# Patient Record
Sex: Female | Born: 1975
Health system: Southern US, Community
[De-identification: ages and names within clinical notes are randomized; demographics above are authoritative.]

## PROBLEM LIST (undated history)

## (undated) DIAGNOSIS — E669 Obesity, unspecified: Secondary | ICD-10-CM

## (undated) DIAGNOSIS — D649 Anemia, unspecified: Secondary | ICD-10-CM

## (undated) DIAGNOSIS — M069 Rheumatoid arthritis, unspecified: Secondary | ICD-10-CM

## (undated) DIAGNOSIS — I10 Essential (primary) hypertension: Secondary | ICD-10-CM

## (undated) HISTORY — DX: Essential (primary) hypertension: I10

## (undated) HISTORY — PX: BREAST SURGERY: SHX581

## (undated) HISTORY — DX: Anemia, unspecified: D64.9

## (undated) HISTORY — PX: TUBAL LIGATION: SHX77

## (undated) HISTORY — PX: REDUCTION MAMMAPLASTY: SUR839

## (undated) HISTORY — PX: APPENDECTOMY: SHX54

---

## 1997-07-17 ENCOUNTER — Emergency Department (HOSPITAL_COMMUNITY): Admission: EM | Admit: 1997-07-17 | Discharge: 1997-07-17 | Payer: Self-pay | Admitting: Internal Medicine

## 1998-09-03 ENCOUNTER — Emergency Department (HOSPITAL_COMMUNITY): Admission: EM | Admit: 1998-09-03 | Discharge: 1998-09-03 | Payer: Self-pay | Admitting: Emergency Medicine

## 1998-09-03 ENCOUNTER — Encounter: Payer: Self-pay | Admitting: Emergency Medicine

## 1998-12-03 ENCOUNTER — Other Ambulatory Visit: Admission: RE | Admit: 1998-12-03 | Discharge: 1998-12-03 | Payer: Self-pay | Admitting: Obstetrics and Gynecology

## 1999-01-15 ENCOUNTER — Ambulatory Visit (HOSPITAL_COMMUNITY): Admission: RE | Admit: 1999-01-15 | Discharge: 1999-01-15 | Payer: Self-pay

## 1999-01-15 ENCOUNTER — Encounter: Payer: Self-pay | Admitting: Obstetrics and Gynecology

## 1999-04-01 ENCOUNTER — Encounter: Payer: Self-pay | Admitting: Obstetrics and Gynecology

## 1999-04-01 ENCOUNTER — Ambulatory Visit (HOSPITAL_COMMUNITY): Admission: RE | Admit: 1999-04-01 | Discharge: 1999-04-01 | Payer: Self-pay | Admitting: Obstetrics and Gynecology

## 1999-04-29 ENCOUNTER — Ambulatory Visit (HOSPITAL_COMMUNITY): Admission: RE | Admit: 1999-04-29 | Discharge: 1999-04-29 | Payer: Self-pay | Admitting: Obstetrics and Gynecology

## 1999-04-29 ENCOUNTER — Encounter: Payer: Self-pay | Admitting: Obstetrics and Gynecology

## 1999-06-08 ENCOUNTER — Inpatient Hospital Stay (HOSPITAL_COMMUNITY): Admission: AD | Admit: 1999-06-08 | Discharge: 1999-06-11 | Payer: Self-pay | Admitting: Obstetrics and Gynecology

## 2006-08-16 LAB — HM HIV SCREENING LAB: HM HIV Screening: NEGATIVE

## 2006-08-16 LAB — HM HEPATITIS C SCREENING LAB: HM Hepatitis Screen: NEGATIVE

## 2007-01-12 ENCOUNTER — Inpatient Hospital Stay (HOSPITAL_COMMUNITY): Admission: RE | Admit: 2007-01-12 | Discharge: 2007-01-15 | Payer: Self-pay | Admitting: Obstetrics and Gynecology

## 2007-01-12 ENCOUNTER — Encounter (INDEPENDENT_AMBULATORY_CARE_PROVIDER_SITE_OTHER): Payer: Self-pay | Admitting: Obstetrics and Gynecology

## 2009-10-02 ENCOUNTER — Emergency Department (HOSPITAL_BASED_OUTPATIENT_CLINIC_OR_DEPARTMENT_OTHER): Admission: EM | Admit: 2009-10-02 | Discharge: 2009-10-02 | Payer: Self-pay | Admitting: Emergency Medicine

## 2010-04-15 LAB — RAPID STREP SCREEN (MED CTR MEBANE ONLY): Streptococcus, Group A Screen (Direct): POSITIVE — AB

## 2010-06-15 NOTE — Discharge Summary (Signed)
[name], [name] NO.:  [id number]   MEDICAL RECORD NO.:  [id number]          PATIENT TYPE:  INP   LOCATION:  9138                          FACILITY:  [facility]   PHYSICIAN:  Zenaida Niece, M.D.DATE OF BIRTH:  04-25-1975   DATE OF ADMISSION:  01/12/2007  DATE OF DISCHARGE:  01/15/2007                               DISCHARGE SUMMARY   ADMISSION DIAGNOSES:  1. Intrauterine pregnancy at 38+ weeks.  2. Prior cesarean section.  3. Desires surgical sterility.   DISCHARGE DIAGNOSES:  1. Intrauterine pregnancy at 38+ weeks.  2. Prior cesarean section.  3. Desires surgical sterility.   PROCEDURES:  On December 12, she had a repeat low transverse cesarean  section and bilateral partial salpingectomy.   HISTORY AND PHYSICAL:  Please see chart for full History and Physical.  Briefly, this is a 35 year old black female, gravida 2, para 1-0-0-1  with an EGA of 38+ weeks who was admitted for repeat cesarean section.  Prenatal care essentially uncomplicated and past history significant for  appendectomy and cesarean section.  Physical exam significant for a  weight of 326 pounds, a benign gravid abdomen with a fundal height of 41  cm, and cervix was closed, thick and high.   HOSPITAL COURSE:  The patient was admitted on the day of surgery and  underwent repeat cesarean section and tubal ligation under spinal  anesthesia.  She had normal anatomy except for some adhesions of the  uterus to the anterior abdominal wall.  She delivered a viable female  infant with Apgars of 9/9, weight 8 pounds 9 ounces.  Placenta delivered  spontaneously and was sent for cord blood collection.  Estimated blood  loss was 800 mL.   Postoperatively, she had no significant complications.  She did  initially have some decreased urine output which just picked up on its  own.  Preoperative hemoglobin 11, postoperative 9.5.  On postoperative  #3, she is felt to be stable enough for discharge  home.  Her incision is  healing well.   DISCHARGE INSTRUCTIONS:  Regular diet, pelvic rest, no strenuous  activity.  Followup is in 4 days for staple removal.   MEDICATIONS:  1. Percocet, #40, one to two p.o. q. 4-6 hours p.r.n. pain.  2. Over-the-counter ibuprofen as needed.   She is given our discharge pamphlet.      Zenaida Niece, M.D.  Electronically Signed     TDM/MEDQ  D:  01/15/2007  T:  01/15/2007  Job:  [id number]

## 2010-06-15 NOTE — Op Note (Signed)
[name], [name] NO.:  [id number]   MEDICAL RECORD NO.:  [id number]          PATIENT TYPE:  INP   LOCATION:  9138                          FACILITY:  [facility]   PHYSICIAN:  Zenaida Niece, M.D.DATE OF BIRTH:  1975/10/28   DATE OF PROCEDURE:  01/12/2007  DATE OF DISCHARGE:                               OPERATIVE REPORT   PREOPERATIVE DIAGNOSES:  1. Intrauterine pregnancy at 38+ weeks.  2. Previous cesarean section.  3. Desires surgical sterility.   POSTOPERATIVE DIAGNOSES:  1. Intrauterine pregnancy at 38+ weeks.  2. Previous cesarean section.  3. Desires surgical sterility.   PROCEDURE:  1. Repeat low transverse cesarean section.  2. Bilateral partial salpingectomy.   SURGEON:  Zenaida Niece, M.D.   ASSISTANT:  Sherron Monday, M.D.   ANESTHESIA:  Spinal.   FINDINGS:  The patient had normal gravid anatomy with 2 adhesions of the  uterus to the anterior abdominal wall.  She delivered a viable female  infant with Apgars of 9 and 9 that weighed 8 pounds 9 ounces.   SPECIMENS:  Placenta sent for cord blood collection.   ESTIMATED BLOOD LOSS:  800 mL.   COMPLICATIONS:  None.   PROCEDURE IN DETAIL:  The patient was taken to the operating room and  placed in the sitting position.  Dr. Jean Rosenthal instilled spinal anesthesia  and she was placed in the dorsal supine position with a left lateral  tilt.  The abdomen was then prepped and draped in the usual sterile  fashion and a Foley catheter was inserted.  The level of her anesthesia  was found to be adequate and her abdomen was entered in a standard  Pfannenstiel incision through her old scar.  Once the abdominal cavity  was entered, there was noted be a small adhesion in the midline and this  was taken down with electrocautery.  A thicker adhesion in the right  fundus was left at this point.  The Alexis disposable self-retaining  retractor was placed and the lower uterine segment was well exposed.  A  4-cm transverse incision was made in the lower uterine segment, pushing  the bladder inferior.  Once the uterine cavity was entered, the incision  was extended bilaterally digitally.  Membranes were ruptured and  revealed clear fluid.  The fetal vertex was grasped and was difficult to  deliver.  A vacuum was applied and with one pull, the head delivered  without difficulty.  Mouth and nares were suctioned and a nuchal cord x3  was reduced.  The remainder of the infant then delivered atraumatically.  Cord was doubly clamped and cut and the infant handed to the awaiting  pediatric team.  Placenta delivered spontaneously and was sent for cord  blood collection.  Uterus was wiped dry with a clean lap pad and all  clots and debris were removed.  The uterine incision was inspected and  found be free of extensions.  Uterine incision was then closed in 1  layer, being a running-locking layer with #1 chromic with adequate  hemostasis.  The adhesion of the right fundus to the anterior abdominal  wall was taken down with electrocautery; this was a fairly thick  adhesion that was taken down and was hemostatic.  Attention was turned  to tubal ligation.  Both fallopian tubes were identified and traced to  their fimbriated ends.  The middle of each tube was grasped with a  Babcock clamp.  A window was made in an avascular portion of the  mesosalpinx.  A 0 plain gut suture was passed through this window, tied  proximally and wrapped around distally to create a knuckle of tube.  The  knuckle of tube was then removed sharply.  This was done bilaterally and  on both sides, both tubal ostia were identified and the stumps were  hemostatic.  The uterine incision was again inspected and found to be  hemostatic.  Subfascial space was then irrigated and made hemostatic  with electrocautery.  Fascia was closed in running fashion starting at  both ends and meeting in the middle with 0 Vicryl.  Subcutaneous tissue   was then irrigated and made hemostatic with electrocautery.  Subcutaneous tissue was then closed with running 2-0 plain gut suture.  Skin was then closed with staples followed by a sterile dressing.  The  patient tolerated the procedure well and was taken to the recovery room  in stable condition.  Counts were correct x2 and she received Ancef 1  gram IV after cord clamp.      Zenaida Niece, M.D.  Electronically Signed     TDM/MEDQ  D:  01/12/2007  T:  01/13/2007  Job:  [id number]

## 2010-06-15 NOTE — H&P (Signed)
[name], [name]             ACCOUNT NO.:  [id number]   MEDICAL RECORD NO.:  [id number]          PATIENT TYPE:  INP   LOCATION:  NA                            [facility]ILITY:  [facility]   PHYSICIAN:  Zenaida Niece, M.D.DATE OF BIRTH:  November 23, 1975   DATE OF ADMISSION:  01/12/2007  DATE OF DISCHARGE:                              HISTORY & PHYSICAL   CHIEF COMPLAINT:  Repeat cesarean section.   HISTORY OF PRESENT ILLNESS:  This is a 35 year old black female, gravida  2, para 1-0-0-1 with an EGA of 38 plus weeks by a 9-week ultrasound with  a due date of January 20, 2007 who presents for repeat cesarean  section.  Prenatal care has been complicated by the fact that the  patient has had a prior cesarean section and initially wished to undergo  a VBAC.  However, she has recently had size greater than dates, and  ultrasound on December 25, 2006 revealed an estimated fetal weight of  3500 plus grams and a normal AFI.  In light of this, options were  discussed with the patient, and she now wishes to proceed with repeat  cesarean section.  She is being admitted for this at this time.   PRENATAL LABORATORIES:  Blood type is O positive with a negative  antibody screen.  RPR nonreactive.  Rubella immune.  Hepatitis B surface  antigen negative.  HIV negative.  Gonorrhea and Chlamydia negative.  One  hour Glucola is 95.  Group B strep is negative.  Prenatal care was  otherwise uncomplicated.   PAST OBSTETRIC HISTORY:  In 2001, cesarean section at 38 plus weeks.  The baby weighed 7 pounds, 13 ounces.  Cesarean section was for failure  to progress and non-reassuring fetal heart rate tracing.   PAST MEDICAL HISTORY:  Otherwise negative.   PAST SURGICAL HISTORY:  1. Appendectomy.  2. Cesarean section.   ALLERGIES:  None known.   MEDICATIONS:  None.   FAMILY HISTORY:  Noncontributory.   REVIEW OF SYSTEMS:  Just normal complaints of pregnancy.   PHYSICAL EXAMINATION:  VITAL SIGNS:  Weight is  326 pounds.  Blood  pressure is 110/70.  GENERAL:  This is an obese, gravid female in no acute distress.  NECK:  Supple without lymphadenopathy or thyromegaly.  LUNGS:  Clear to auscultation.  HEART:  Regular rate and rhythm without murmur.  ABDOMEN:  Obese, gravid, nontender with a fundal height of 41 cm.  EXTREMITIES:  The extremities have 1+ edema and are nontender.  Cervix is closed, thick and high with a vertex presentation.   ASSESSMENT:  Intrauterine pregnancy at 38 plus weeks with prior cesarean  section.  The patient now wishes to undergo repeat cesarean section.  All risks of surgery have been discussed, and she wishes to proceed.  The patient also has recently informed us that she wants a tubal  ligation.  She understands this is permanent and that there is  approximately a 1 in 150 failure rate.   PLAN:  The plan is to admit the patient on the day of surgery for a  repeat cesarean section and  tubal ligation.      Zenaida Niece, M.D.  Electronically Signed     Zenaida Niece, M.D.  Electronically Signed    TDM/MEDQ  D:  01/11/2007  T:  01/11/2007  Job:  [id number]

## 2010-06-18 NOTE — H&P (Signed)
[facility]  Patient:    CHALENE, TREU                    MRN: [id number] Adm. Date:  [id number] Attending:  Oliver Pila                         History and Physical  HISTORY OF PRESENT ILLNESS:   This is a 35 year old black female, para 0, gravida 1, at 38-5/7 weeks with [facility] of Jun 17, 1999, by first trimester sonogram.  She presented to labor and delivery complaining of contractions every five minutes or two hours.  Cervical examination in the triage unit was 1 to 2 cm, 90%, and -1. The patient was very uncomfortable, breathing with contractions.  Pregnancy was complicated by possible suspected macrosomia at 13 weeks with mild fetal pyelectasis, however, repeat sonogram at 34 weeks showed no pyelectasis and fetal weight 2298 grams or 65th percentile.  Otherwise, the patient had positive Trichomoniasis in the first trimester for which she and her partner were treated. Blood group and type O positive with a negative antibody, sickle cell negative, RPR nonreactive, rubella immune, hepatitis B surface antigen negative, HIV negative, GC and Chlamydia negative, Group B Strep negative.  PAST OBSTETRIC HISTORY:       Negative.  PAST MEDICAL HISTORY:         Negative.  PAST SURGICAL HISTORY:        Appendectomy in 1985.  PAST GYN HISTORY:             Trichomoniasis as noted in the present illness.  MEDICATIONS:                  None.  ALLERGIES:                    No known drug allergies.  FAMILY HISTORY:               Unremarkable.  PHYSICAL EXAMINATION:  GENERAL:                      Revealed a well-developed, well-nourished black female quite obese with a blood pressure 107/66, pulse 74, and temperature 99.0.  HEENT:                        Unremarkable.  LUNGS:                        Clear to P&A.  HEART:                        Normal size and sounds, no murmurs.  ABDOMEN:                      Gravid.  Fundal height on Jun 03, 1999, was 38 cm.  Fetal heart tones showed recurrent late decellerations, but good variability.  PELVIC:                       Cervix on admission was completely effaced, 2 cm, -1 station.  Alvino Chapel, M.D. performed amniotic rupture with clear fluid.  IMPRESSION:                   Intrauterine pregnancy at 38+ weeks, early labor.   The patient was later begun on  Pitocin augmentation, but began having late decellerations some time after an epidural was placed.  The Pitocin was discontinued.  The patient continued to contract on her own.  She wanted to restart the Pitocin, but the nurse called me at 7:30 p.m. and claimed that the patient as having late decellerations.  I came and restarted the Pitocin because at that time she was not having decellerations and she did have some mild decellerations after I restarted the Pitocin, but the disturbing fact was that the cervix remained 3 cm and the vertex was at -2 station.  The cervix had remained 3 cm for at least eight or nine hours inspite of some contractions and admittedly not ideal contractions because of the decellerations making use of Pitocin unwise in my opinion.  The situation of failure to progress and recurrent decellerations has been discussed with the patient and her partner and we are prepared to move to cesarean section. DD:  06/08/99 TD:  06/09/99 Job: [id number]

## 2010-06-18 NOTE — Discharge Summary (Signed)
[facility]  Patient:    [name], [name]                    MRN: [id number] Proc. Date: 06/08/99 Adm. Date:  [id number] Disc. Date: [id number] Attending:  Oliver Pila                           Discharge Summary  HISTORY OF PRESENT ILLNESS:   A 35 year old black female, para 0, gravida 1, at  38-5/[redacted] weeks gestation, [facility] Jun 17, 1999, by first trimester sonogram, presented to labor and delivery complaining of uterine contractions every five minutes for two hours.  In the maternity admissions unit, cervix was 1 to 2 cm, 90%, and vertex -1 station.  The patient was very uncomfortable, but breathing with contractions.  Pregnancy was complicated by possible macrosomia at 32 weeks with mild fetal pyelectasis, however, repeat sonogram at 34 weeks showed no pyelectasis and fetal weight was 2298 grams or 65th percentile.  Otherwise, the patient had positive Trichomoniasis in the first trimester for which she and her partner were treated. Blood group and type O positive with a negative antibody, negative sickle cell, RPR nonreactive, rubella immune, hepatitis B surface antigen negative, HIV negative, GC and Chlamydia negative, Group B Strep negative.  One-hour Glucola was 95.  HOSPITAL COURSE:              After admission to the hospital, the patient had artificial rupture of membranes by Alvino Chapel, M.D.  By 1:35 p.m. the cervix remained at 3 cm.  She requested an epidural.  Pitocin was begun, but discontinued because of late decellerations.  By 5:45 p.m. the contractions were every three to five minutes.  The cervix remained 3 cm.  Pitocin was asked to be begun again, but because of late decellerations, the nurse did not begin the Pitocin.  By 7:30 p.m. I had evaluated the patient and started the Pitocin again. There were some decellerations, but the item that pushed me to cesarean section was that inspite of contractions, she  remained at 3 cm for over eight hours.  She then went to the cesarean section room for a low transverse cesarean section by Malachi Pro. Ambrose Mantle, M.D. under epidural anesthesia with delivery of a 7 pound 13 ounce female, Apgars 8 at one minute and 9 at five minutes.  Estimated blood loss was about 1000 cc.  Postoperatively, the patient had a very uneventful course.  Staples were removed.  Steri-Strips were applied on the third postoperative day and she was discharged.  LABORATORY DATA:              Hemoglobin on admission of 10.3, hematocrit 30.9,  white count 11,300, and platelet count 271,000.  First postoperative day hemoglobin 9.2, hematocrit 27.7, platelet count 231,000, and white count 17,000.  RPR was nonreactive.  FINAL DIAGNOSES:              1. Intrauterine pregnancy at 38+ weeks.                               2. Delivered vertex by cesarean section.                               3. Failure to progress in labor.  4. Nonreassuring fetal heart rate pattern.  PROCEDURE:                    Low transverse cesarean section.  CONDITION ON DISCHARGE:       Improved.  DISCHARGE INSTRUCTIONS:       Include our regular discharge instruction booklet. Specifically she is asked not to do any heavy lifting or strenuous activity, and to call with any extremely heavy bleeding or temperature elevation above 100 degrees, call with any unusual problems.  DISCHARGE MEDICATIONS:        1. Ibuprofen 800 mg 20 tablets one every eight hours                                  as needed is given at discharge.  FOLLOW-UP:                    The patient is advised to return in 10 to 14 days for follow-up examination. DD:  06/11/99 TD:  06/14/99 Job: [id number]

## 2010-06-18 NOTE — Op Note (Signed)
[facility]  Patient:    [name], [name]                    MRN: [id number] Proc. Date: 06/08/99 Adm. Date:  [id number] Attending:  Oliver Pila                           Operative Report  PREOPERATIVE DIAGNOSIS:       Intrauterine pregnancy at 38-1/2 weeks.  Failure o progress in labor.  Nonreassuring fetal heart rate pattern.  POSTOPERATIVE DIAGNOSIS:      Intrauterine pregnancy at 38-1/2 weeks.  Failure o progress in labor.  Nonreassuring fetal heart rate pattern.  OPERATION:                    Low transverse cesarean section.  SURGEON:                      Malachi Pro. Ambrose Mantle, M.D.  ASSISTANT:  ANESTHESIA:                   Epidural.  ESTIMATED BLOOD LOSS:  DESCRIPTION OF PROCEDURE:     The patient was brought to the operating room and the epidural anesthetic was boosted.  During the time she was on the operating table, the fetal heart rate drifted as low as 85 and again to 100, but otherwise was in the 120 to 140 range.  The abdomen was prepped with Betadine solution and draped as a sterile field.  Anesthesia was confirmed.  A Foley catheter was already indwelling.  The abdomen was entered by a transverse incision through the skin,  subcutaneous tissue, and fascia.  The fascia was then separated from the rectus  muscles superiorly and inferiorly.  The rectus muscles were then split in the midline and the peritoneum was opened vertically.  The lower uterine segment was exposed.  I did not advance the bladder.  I could tell that I was well above the bladder, so I went through the peritoneum, through the uterine muscle, entered he endometrial cavity with my finger, extended the incision with the bandage scissors in a slightly upward and lateral direction on both sides of the midline.  I delivered the vertex through the incisional opening, suctioned the nose and pharynx, and then delivered the remainder of the body.  Cord was  clamped and the infant was given to [place] Q. Mikle Bosworth, M.D. who was in attendance.  She assigned a 7 pound 13 ounce female infant, Apgars 8 at one minute and 9 at five minutes.  The placenta was then delivered intact.  The inside of the uterus was inspected and  found to be free of any fragments.  Additional membranes were removed.  Then the uterus was closed in two layers using a running locked suture of 0 Vicryl on the first layer, nonlocking suture of the same material on the second layer, a couple of extra figure-of-eight sutures were used to completely oppose the myometrium.  Hemostasis was achieved along the cut edge of the peritoneum over the lower uterine segment with the cautery.  No bleeding was noted.  The peritoneum seemed to sort of fold together, so I did not use suture.  I liberally irrigated the abdominal cavity.  There was no bleeding.  Both tubes and ovaries appeared normal as did he uterus.  The peritoneum of the abdominal wall was then held together.  I  irrigated the extraperitoneal space.  There was no significant bleeding.  I did not close the peritoneum.  I closed the rectus muscles with several interrupted sutures of 0 Vicryl and then closed the fascia with two running sutures of 0 Vicryl, the subcu with a running 3-0 Vicryl, and the skin was closed with staples.  The patient seemed to tolerate the procedure well.  Estimated blood loss was about 800 to 1000 cc.  Sponge, needle, and instrument counts were correct.  She was returned to the recovery room in satisfactory condition. DD:  06/08/99 TD:  06/09/99 Job: [id number]

## 2010-11-08 LAB — CBC
HCT: 28.1 — ABNORMAL LOW
HCT: 32.5 — ABNORMAL LOW
Hemoglobin: 11 — ABNORMAL LOW
Hemoglobin: 9.5 — ABNORMAL LOW
MCHC: 33.9
MCV: 83
RBC: 3.38 — ABNORMAL LOW
RBC: 3.88
WBC: 9.9

## 2014-08-06 ENCOUNTER — Encounter (HOSPITAL_BASED_OUTPATIENT_CLINIC_OR_DEPARTMENT_OTHER): Payer: Self-pay | Admitting: Emergency Medicine

## 2014-08-06 ENCOUNTER — Emergency Department (HOSPITAL_BASED_OUTPATIENT_CLINIC_OR_DEPARTMENT_OTHER): Payer: Managed Care, Other (non HMO)

## 2014-08-06 ENCOUNTER — Emergency Department (HOSPITAL_BASED_OUTPATIENT_CLINIC_OR_DEPARTMENT_OTHER)
Admission: EM | Admit: 2014-08-06 | Discharge: 2014-08-06 | Disposition: A | Discharge status: Home / Self Care | Payer: Managed Care, Other (non HMO) | Attending: Emergency Medicine | Admitting: Emergency Medicine

## 2014-08-06 DIAGNOSIS — J40 Bronchitis, not specified as acute or chronic: Secondary | ICD-10-CM | POA: Insufficient documentation

## 2014-08-06 DIAGNOSIS — R05 Cough: Secondary | ICD-10-CM | POA: Diagnosis present

## 2014-08-06 MED ORDER — AEROCHAMBER Z-STAT PLUS/MEDIUM MISC
1.0000 | Freq: Once | Status: AC
  Administered 2014-08-06: 1
  Filled 2014-08-06: qty 1

## 2014-08-06 MED ORDER — BENZONATATE 100 MG PO CAPS: 100.0000 mg | ORAL_CAPSULE | Freq: Three times a day (TID) | ORAL | Status: DC

## 2014-08-06 MED ORDER — ALBUTEROL SULFATE HFA 108 (90 BASE) MCG/ACT IN AERS
2.0000 | INHALATION_SPRAY | Freq: Once | RESPIRATORY_TRACT | Status: AC
  Administered 2014-08-06: 2 via RESPIRATORY_TRACT
  Filled 2014-08-06: qty 6.7

## 2014-08-06 NOTE — ED Notes (Signed)
Patient transported to X-ray 

## 2014-08-06 NOTE — ED Provider Notes (Signed)
CSN: [id number]     Arrival date & time 08/06/14  2025 History   First MD Initiated Contact with Patient 08/06/14 2040     Chief Complaint  Patient presents with  . Cough     (Consider location/radiation/quality/duration/timing/severity/associated sxs/prior Treatment) HPI Comments: Patient presents with complaint of cough for one week. Patient initially had some nasal congestion and cough was mildly productive but it has since become a dry cough. She reports occasional wheezing. No fevers, shortness of breath. No signs and symptoms or risk factors for DVT/PE. Patient does not have a history of heart failure. She's never had asthma or bronchitis. She is not a smoker. No treatments prior to arrival. Onset of symptoms acute. Course is constant. Nothing makes symptoms better or worse.  The history is provided by the patient.    History reviewed. No pertinent past medical history. Past Surgical History  Procedure Laterality Date  . Cesarean section      X2  . Breast surgery    . Appendectomy    . Tubal ligation     No family history on file. History  Substance Use Topics  . Smoking status: Never Smoker   . Smokeless tobacco: Not on file  . Alcohol Use: Yes     Comment: occ   OB History    No data available     Review of Systems  Constitutional: Negative for fever, chills and fatigue.  HENT: Positive for congestion. Negative for ear pain, rhinorrhea, sinus pressure and sore throat.   Eyes: Negative for redness.  Respiratory: Positive for cough and wheezing.   Gastrointestinal: Negative for nausea, vomiting, abdominal pain and diarrhea.  Genitourinary: Negative for dysuria.  Musculoskeletal: Negative for myalgias and neck stiffness.  Skin: Negative for rash.  Neurological: Negative for headaches.  Hematological: Negative for adenopathy.    Allergies  Review of patient's allergies indicates no known allergies.  Home Medications   Prior to Admission medications   Not on  File   BP 132/93 mmHg  Pulse 84  Temp(Src) 98.5 F (36.9 C) (Oral)  Resp 18  Ht 5' 7.5" (1.715 m)  Wt 300 lb (136.079 kg)  BMI 46.27 kg/m2  SpO2 100%  LMP 08/06/2014   Physical Exam  Constitutional: She appears well-developed and well-nourished.  HENT:  Head: Normocephalic and atraumatic.  Right Ear: Tympanic membrane, external ear and ear canal normal.  Left Ear: Tympanic membrane, external ear and ear canal normal.  Nose: Mucosal edema present. No rhinorrhea.  Mouth/Throat: Uvula is midline, oropharynx is clear and moist and mucous membranes are normal.  Eyes: Conjunctivae are normal. Right eye exhibits no discharge. Left eye exhibits no discharge.  Neck: Normal range of motion. Neck supple.  Cardiovascular: Normal rate, regular rhythm and normal heart sounds.   Pulmonary/Chest: Effort normal and breath sounds normal.  Abdominal: Soft. There is no tenderness.  Neurological: She is alert.  Skin: Skin is warm and dry.  Psychiatric: She has a normal mood and affect.  Nursing note and vitals reviewed.   ED Course  Procedures (including critical care time) Labs Review Labs Reviewed - No data to display  Imaging Review Dg Chest 2 View  08/06/2014   CLINICAL DATA:  Cough and wheezing for 1 week.  EXAM: CHEST  2 VIEW  COMPARISON:  None.  FINDINGS: The lungs are clear. Heart size is normal. No pneumothorax or pleural effusion. No focal bony abnormality.  IMPRESSION: No acute disease.   Electronically Signed   By: Marcello Moores  Dalessio M.D.   On: 08/06/2014 21:25     EKG Interpretation None       8:55 PM Patient seen and examined. Work-up initiated. Medications ordered.   Vital signs reviewed and are as follows: BP 132/93 mmHg  Pulse 84  Temp(Src) 98.5 F (36.9 C) (Oral)  Resp 18  Ht 5' 7.5" (1.715 m)  Wt 300 lb (136.079 kg)  BMI 46.27 kg/m2  SpO2 100%  LMP 08/06/2014  10:00 PM patient updated on x-ray results. Will discharge to home with albuterol inhaler, Tessalon.  Patient encouraged to return with worsening symptoms including fever, trouble breathing, shortness of breath, leg swelling, or other concerns. Patient verbalizes understanding and agrees with plan.  MDM   Final diagnoses:  Bronchitis   Patient with likely viral bronchitis. No concern for PNA given normal lung exam/x-ray. Antibiotics not indicated. Conservative therapy indicated.      Carlisle Cater, PA-C 08/06/14 2202  Tanna Furry, MD 08/07/14 [phone]

## 2014-08-06 NOTE — ED Notes (Signed)
Dry cough for a week. Has not taken anything OTC.

## 2014-08-06 NOTE — Discharge Instructions (Signed)
Please read and follow all provided instructions.  Your diagnoses today include:  1. Bronchitis    Tests performed today include:  Chest x-ray - does not show any pneumonia  Vital signs. See below for your results today.   Medications prescribed:   Albuterol inhaler - medication that opens up your airway  Use inhaler as follows: 1-2 puffs with spacer every 4 hours as needed for wheezing, cough, or shortness of breath.    Tessalon Perles - cough suppressant medication  Take any prescribed medications only as directed.  Home care instructions:  Follow any educational materials contained in this packet.  Follow-up instructions: Please follow-up with your primary care provider in the next 3 days for further evaluation of your symptoms and a recheck if you are not feeling better.   Return instructions:   Please return to the Emergency Department if you experience worsening symptoms.  Please return with worsening wheezing, shortness of breath, or difficulty breathing.  Return with persistent fever above 101F.   Please return if you have any other emergent concerns.  Additional Information:  Your vital signs today were: BP 132/93 mmHg   Pulse 84   Temp(Src) 98.5 F (36.9 C) (Oral)   Resp 18   Ht 5' 7.5" (1.715 m)   Wt 300 lb (136.079 kg)   BMI 46.27 kg/m2   SpO2 100%   LMP 08/06/2014 If your blood pressure (BP) was elevated above 135/85 this visit, please have this repeated by your doctor within one month. --------------

## 2014-09-03 ENCOUNTER — Encounter (HOSPITAL_BASED_OUTPATIENT_CLINIC_OR_DEPARTMENT_OTHER): Payer: Self-pay | Admitting: Emergency Medicine

## 2014-09-03 ENCOUNTER — Emergency Department (HOSPITAL_BASED_OUTPATIENT_CLINIC_OR_DEPARTMENT_OTHER)
Admission: EM | Admit: 2014-09-03 | Discharge: 2014-09-03 | Disposition: A | Discharge status: Home / Self Care | Payer: Managed Care, Other (non HMO) | Attending: Emergency Medicine | Admitting: Emergency Medicine

## 2014-09-03 DIAGNOSIS — Y9389 Activity, other specified: Secondary | ICD-10-CM | POA: Diagnosis not present

## 2014-09-03 DIAGNOSIS — Y998 Other external cause status: Secondary | ICD-10-CM | POA: Diagnosis not present

## 2014-09-03 DIAGNOSIS — S3992XA Unspecified injury of lower back, initial encounter: Secondary | ICD-10-CM | POA: Diagnosis present

## 2014-09-03 DIAGNOSIS — S39012A Strain of muscle, fascia and tendon of lower back, initial encounter: Secondary | ICD-10-CM

## 2014-09-03 DIAGNOSIS — Y92410 Unspecified street and highway as the place of occurrence of the external cause: Secondary | ICD-10-CM | POA: Diagnosis not present

## 2014-09-03 MED ORDER — IBUPROFEN 800 MG PO TABS
800.0000 mg | ORAL_TABLET | Freq: Once | ORAL | Status: AC
  Administered 2014-09-03: 800 mg via ORAL
  Filled 2014-09-03: qty 1

## 2014-09-03 MED ORDER — OXYCODONE-ACETAMINOPHEN 5-325 MG PO TABS
2.0000 | ORAL_TABLET | Freq: Once | ORAL | Status: AC
  Administered 2014-09-03: 2 via ORAL
  Filled 2014-09-03: qty 2

## 2014-09-03 MED ORDER — IBUPROFEN 800 MG PO TABS: 800.0000 mg | ORAL_TABLET | Freq: Three times a day (TID) | ORAL | Status: DC | PRN

## 2014-09-03 MED ORDER — CYCLOBENZAPRINE HCL 5 MG PO TABS: 5.0000 mg | ORAL_TABLET | Freq: Three times a day (TID) | ORAL | Status: DC | PRN

## 2014-09-03 MED ORDER — OXYCODONE-ACETAMINOPHEN 5-325 MG PO TABS: 1.0000 | ORAL_TABLET | Freq: Four times a day (QID) | ORAL | Status: DC | PRN
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
## 2014-09-03 NOTE — ED Notes (Signed)
MD at bedside. 
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
## 2016-07-02 ENCOUNTER — Encounter (HOSPITAL_BASED_OUTPATIENT_CLINIC_OR_DEPARTMENT_OTHER): Payer: Self-pay

## 2016-07-02 ENCOUNTER — Emergency Department (HOSPITAL_BASED_OUTPATIENT_CLINIC_OR_DEPARTMENT_OTHER)
Admission: EM | Admit: 2016-07-02 | Discharge: 2016-07-02 | Disposition: A | Discharge status: Home / Self Care | Payer: 59 | Attending: Emergency Medicine | Admitting: Emergency Medicine

## 2016-07-02 DIAGNOSIS — H9202 Otalgia, left ear: Secondary | ICD-10-CM | POA: Diagnosis present

## 2016-07-02 DIAGNOSIS — Z79899 Other long term (current) drug therapy: Secondary | ICD-10-CM | POA: Diagnosis not present

## 2016-07-02 DIAGNOSIS — M26622 Arthralgia of left temporomandibular joint: Secondary | ICD-10-CM | POA: Diagnosis not present

## 2016-07-02 DIAGNOSIS — Z7722 Contact with and (suspected) exposure to environmental tobacco smoke (acute) (chronic): Secondary | ICD-10-CM | POA: Diagnosis not present

## 2016-07-02 MED ORDER — CYCLOBENZAPRINE HCL 10 MG PO TABS: 10.0000 mg | ORAL_TABLET | Freq: Three times a day (TID) | ORAL | 0 refills | Status: DC | PRN

## 2016-07-02 MED ORDER — OXYCODONE-ACETAMINOPHEN 5-325 MG PO TABS: 1.0000 | ORAL_TABLET | Freq: Four times a day (QID) | ORAL | 0 refills | Status: DC | PRN

## 2016-07-02 NOTE — ED Provider Notes (Signed)
[facility] Provider Note: Latoya Spurling, MD, FACEP  CSN: [id number] MRN: [id number] ARRIVAL: 07/02/16 at [place]: [place] is a 41 y.o. female who has had sinus congestion for the past several days. She states she yawned about 9:30 PM and felt a pop in her left ear. She now has pain just anterior to her left ear at the TMJ. There is tenderness to palpation but not with movement of the jaw. She states it sounds like her hearing asthma: The left but she has had no drainage from the left ear and movement of the left ear does not cause pain. She rates her pain as a 9 out of 10.   History reviewed. No pertinent past medical history.  Past Surgical History:  Procedure Laterality Date  . APPENDECTOMY    . BREAST SURGERY    . CESAREAN SECTION     X2  . TUBAL LIGATION      No family history on file.  Social History  Substance Use Topics  . Smoking status: Passive Smoke Exposure - Never Smoker  . Smokeless tobacco: Not on file  . Alcohol use Yes     Comment: occ    Prior to Admission medications   Medication Sig Start Date End Date Taking? Authorizing Provider  cyclobenzaprine (FLEXERIL) 10 MG tablet Take 1 tablet (10 mg total) by mouth 3 (three) times daily as needed (for jaw spasms). 07/02/16   Cecia Egge, MD  oxyCODONE-acetaminophen (PERCOCET/ROXICET) 5-325 MG tablet Take 1 tablet by mouth every 6 (six) hours as needed (for pain). 07/02/16   Mekiah [place], Jenny Reichmann, MD    Allergies Patient has no known allergies.   REVIEW OF SYSTEMS  Negative except as noted here or in the History of Present Illness.   PHYSICAL EXAMINATION  Initial Vital Signs Blood pressure (!) 152/112, pulse 89, temperature 98.2 F (36.8 C), temperature source Oral, resp. rate 18, height 5\' 8"  (1.727 m), weight (!) 140.6 kg (310 lb), last menstrual period 06/13/2016, SpO2 100 %.  Examination General: Well-developed,  well-nourished female in no acute distress; appearance consistent with age of record HENT: normocephalic; atraumatic; right TM normal; left TM obscured by cerumen; no drainage from left ear; note pain on movement of left external ear; tenderness of left TMJ on palpation but not with movement of the jaw Eyes: pupils equal, round and reactive to light; extraocular muscles intact Neck: supple Heart: regular rate and rhythm Lungs: clear to auscultation bilaterally Abdomen: soft; nondistended; nontender; bowel sounds present Extremities: No deformity; full range of motion; pulses normal Neurologic: Awake, alert and oriented; motor function intact in all extremities and symmetric; no facial droop Skin: Warm and dry Psychiatric: Normal mood and affect   RESULTS  Summary of this visit's results, reviewed by myself:   EKG Interpretation  Date/Time:    Ventricular Rate:    PR Interval:    QRS Duration:   QT Interval:    QTC Calculation:   R Axis:     Text Interpretation:        Laboratory Studies: No results found for this or any previous visit (from the past 24 hour(s)). Imaging Studies: No results found.  ED COURSE  Nursing notes and initial vitals signs, including pulse oximetry, reviewed.  Vitals:   07/02/16 0304 07/02/16 0306  BP:  (!) 152/112  Pulse:  89  Resp:  18  Temp:  98.2 F (  36.8 C)  TempSrc:  Oral  SpO2:  100%  Weight: (!) 140.6 kg (310 lb)   Height: 5\' 8"  (1.727 m)    3:16 AM The patient's history of her pain being initiated by a pop while yawning as well as tenderness of the left TMJ argues in favor of acute TMJ pain. Her recent nasal congestion and muffled hearing suggest an optic etiology. We will treat her pain and given a muscle relaxant. I do not believe an antibiotic is indicated at this time.  PROCEDURES    ED DIAGNOSES     ICD-9-CM ICD-10-CM   1. Arthralgia of left temporomandibular joint 524.62 M26.622        [name], Jenny Reichmann, MD 07/02/16  [phone]

## 2016-07-02 NOTE — ED Notes (Signed)
Pt verbalizes understanding of d/c instructions and denies any further needs at this time. 

## 2016-07-02 NOTE — ED Triage Notes (Signed)
Pt c/o left ear pain since 2200 after hearing a "pop."

## 2019-03-11 ENCOUNTER — Other Ambulatory Visit: Payer: Self-pay

## 2019-03-11 ENCOUNTER — Emergency Department (HOSPITAL_BASED_OUTPATIENT_CLINIC_OR_DEPARTMENT_OTHER)
Admission: EM | Admit: 2019-03-11 | Discharge: 2019-03-11 | Disposition: A | Discharge status: Home / Self Care | Payer: 59 | Attending: Emergency Medicine | Admitting: Emergency Medicine

## 2019-03-11 ENCOUNTER — Encounter (HOSPITAL_BASED_OUTPATIENT_CLINIC_OR_DEPARTMENT_OTHER): Payer: Self-pay | Admitting: Emergency Medicine

## 2019-03-11 DIAGNOSIS — L02419 Cutaneous abscess of limb, unspecified: Secondary | ICD-10-CM

## 2019-03-11 DIAGNOSIS — L03112 Cellulitis of left axilla: Secondary | ICD-10-CM | POA: Insufficient documentation

## 2019-03-11 HISTORY — DX: Obesity, unspecified: E66.9

## 2019-03-11 MED ORDER — LIDOCAINE-EPINEPHRINE 1 %-1:100000 IJ SOLN
20.0000 mL | Freq: Once | INTRAMUSCULAR | Status: AC
Start: 2019-03-11 — End: 2019-03-11
  Administered 2019-03-11: 20 mL

## 2019-03-11 MED ORDER — SULFAMETHOXAZOLE-TRIMETHOPRIM 800-160 MG PO TABS
1.0000 | ORAL_TABLET | Freq: Once | ORAL | Status: AC
  Administered 2019-03-11: 1 via ORAL
  Filled 2019-03-11: qty 1

## 2019-03-11 MED ORDER — SULFAMETHOXAZOLE-TRIMETHOPRIM 800-160 MG PO TABS: 1.0000 | ORAL_TABLET | Freq: Two times a day (BID) | ORAL | 0 refills | Status: AC

## 2019-03-11 MED ORDER — HYDROCODONE-ACETAMINOPHEN 5-325 MG PO TABS: ORAL_TABLET | ORAL | 0 refills | Status: DC

## 2019-03-11 NOTE — ED Notes (Signed)
ED Provider at bedside. 

## 2019-03-11 NOTE — ED Triage Notes (Signed)
Abscess to left axilla x2 weeks.

## 2019-03-11 NOTE — ED Provider Notes (Signed)
[facility] EMERGENCY DEPARTMENT Provider Note   CSN: [id number] Arrival date & time: 03/11/19  1820     History Chief Complaint  Patient presents with  . Abscess    [name] is a 44 y.o. female.  Patient with history of abscess presents the emergency department with 2 weeks of worsening swelling and pain in her left axilla.  Area has not been draining.  Pain is worse with palpation.  She has been a applying warm compresses at home.  No medications prior to arrival.  No fevers, nausea or vomiting.  No history of diabetes.        Past Medical History:  Diagnosis Date  . Obesity     There are no problems to display for this patient.   Past Surgical History:  Procedure Laterality Date  . APPENDECTOMY    . BREAST SURGERY    . CESAREAN SECTION     X2  . TUBAL LIGATION       OB History   No obstetric history on file.     No family history on file.  Social History   Tobacco Use  . Smoking status: Passive Smoke Exposure - Never Smoker  . Smokeless tobacco: Never Used  Substance Use Topics  . Alcohol use: Yes    Comment: occ  . Drug use: No    Home Medications Prior to Admission medications   Medication Sig Start Date End Date Taking? Authorizing Provider  HYDROcodone-acetaminophen (NORCO/VICODIN) 5-325 MG tablet Take 1-2 tablets every 6 hours as needed for severe pain 03/11/19   Carlisle Cater, PA-C  sulfamethoxazole-trimethoprim (BACTRIM DS) 800-160 MG tablet Take 1 tablet by mouth 2 (two) times daily for 7 days. 03/11/19 03/18/19  Carlisle Cater, PA-C    Allergies    Patient has no known allergies.  Review of Systems   Review of Systems  Constitutional: Negative for fever.  Gastrointestinal: Negative for nausea and vomiting.  Skin: Positive for color change.       Positive for abscess.  Hematological: Negative for adenopathy.    Physical Exam Updated Vital Signs BP (!) 144/104 (BP Location: Right Arm)   Pulse 78   Temp 98.3 F  (36.8 C) (Oral)   Resp 18   Ht 5\' 7"  (1.702 m)   Wt (!) 140.8 kg   LMP 02/25/2019   SpO2 100%   BMI 48.62 kg/m   Physical Exam Vitals and nursing note reviewed.  Constitutional:      Appearance: She is well-developed.  HENT:     Head: Normocephalic and atraumatic.  Eyes:     Conjunctiva/sclera: Conjunctivae normal.  Pulmonary:     Effort: No respiratory distress.  Musculoskeletal:     Cervical back: Normal range of motion and neck supple.  Skin:    General: Skin is warm and dry.     Comments: Patient with an approximately 6 cm x 4 cm area of induration in the left axilla with overlying erythema.  There is adjacent spread of erythema to the anterior portion of the left shoulder.  Neurological:     Mental Status: She is alert.     ED Results / Procedures / Treatments   Labs (all labs ordered are listed, but only abnormal results are displayed) Labs Reviewed - No data to display  EKG None  Radiology No results found.  Procedures .Marland KitchenIncision and Drainage  Date/Time: 03/11/2019 7:47 PM Performed by: Carlisle Cater, PA-C Authorized by: Carlisle Cater, PA-C   Consent:  Consent obtained:  Verbal   Consent given by:  Patient   Risks discussed:  Pain and bleeding   Alternatives discussed:  No treatment Location:    Type:  Abscess   Size:  6cm   Location:  Upper extremity   Upper extremity location:  Arm   Arm location:  L upper arm Pre-procedure details:    Skin preparation:  Chloraprep Anesthesia (see MAR for exact dosages):    Anesthesia method:  Local infiltration   Local anesthetic:  Lidocaine 1% WITH epi Procedure type:    Complexity:  Complex Procedure details:    Incision types:  Stab incision   Incision depth:  Dermal   Scalpel blade:  11   Wound management:  Probed and deloculated and extensive cleaning   Drainage:  Purulent   Drainage amount:  Copious   Wound treatment:  Wound left open and drain placed   Packing materials:  1/4 in  gauze Post-procedure details:    Patient tolerance of procedure:  Tolerated well, no immediate complications   (including critical care time)  Medications Ordered in ED Medications  sulfamethoxazole-trimethoprim (BACTRIM DS) 800-160 MG per tablet 1 tablet (1 tablet Oral Given 03/11/19 1908)  lidocaine-EPINEPHrine (XYLOCAINE W/EPI) 1 %-1:100000 (with pres) injection 20 mL (20 mLs Infiltration Given 03/11/19 1909)    ED Course  I have reviewed the triage vital signs and the nursing notes.  Pertinent labs & imaging results that were available during my care of the patient were reviewed by me and considered in my medical decision making (see chart for details).  Patient seen and examined. Work-up initiated. Medications ordered.   Vital signs reviewed and are as follows: BP (!) 144/104 (BP Location: Right Arm)   Pulse 78   Temp 98.3 F (36.8 C) (Oral)   Resp 18   Ht 5\' 7"  (1.702 m)   Wt (!) 140.8 kg   LMP 02/25/2019   SpO2 100%   BMI 48.62 kg/m   7:51 PM The patient was urged to return to the Emergency Department urgently with worsening pain, swelling, expanding erythema especially if it streaks away from the affected area, fever, or if they have any other concerns.   The patient was urged to return to the Emergency Department or go to their PCP in 48 hours for wound recheck if the area is not significantly improved.  The patient verbalized understanding and stated agreement with this plan.   Patient counseled on use of narcotic pain medications. Counseled not to combine these medications with others containing tylenol. Urged not to drink alcohol, drive, or perform any other activities that requires focus while taking these medications. The patient verbalizes understanding and agrees with the plan.     MDM Rules/Calculators/A&P                      Patient with abscess, I&D as above, with associated cellulitis.  Patient started on Bactrim.  No signs of sepsis or other systemic  symptoms.  Patient discharged home with #8 Vicodin for pain control in the next 1 to 2 days.   Final Clinical Impression(s) / ED Diagnoses Final diagnoses:  Axillary abscess    Rx / DC Orders ED Discharge Orders         Ordered    sulfamethoxazole-trimethoprim (BACTRIM DS) 800-160 MG tablet  2 times daily     03/11/19 1930    HYDROcodone-acetaminophen (NORCO/VICODIN) 5-325 MG tablet     03/11/19 1930  Carlisle Cater, PA-C 03/11/19 1951    Fredia Sorrow, MD 03/11/19 2213

## 2019-03-11 NOTE — Discharge Instructions (Signed)
Please read and follow all provided instructions.  Your diagnoses today include:  1. Axillary abscess     Tests performed today include:  Vital signs. See below for your results today.   Medications prescribed:   Bactrim (trimethoprim/sulfamethoxazole) - antibiotic  You have been prescribed an antibiotic medicine: take the entire course of medicine even if you are feeling better. Stopping early can cause the antibiotic not to work.   Vicodin (hydrocodone/acetaminophen) - narcotic pain medication  DO NOT drive or perform any activities that require you to be awake and alert because this medicine can make you drowsy. BE VERY CAREFUL not to take multiple medicines containing Tylenol (also called acetaminophen). Doing so can lead to an overdose which can damage your liver and cause liver failure and possibly death.  Take any prescribed medications only as directed.   Home care instructions:   Follow any educational materials contained in this packet  Follow-up instructions: Return to the Emergency Department in 48 hours for a recheck if your symptoms are not significantly improved.  Please follow-up with your primary care provider in the next 1 week for further evaluation of your symptoms.   Return instructions:  Return to the Emergency Department if you have:  Fever  Worsening symptoms  Worsening pain  Worsening swelling  Redness of the skin that moves away from the affected area, especially if it streaks away from the affected area   Any other emergent concerns  Your vital signs today were: BP (!) 156/101 (BP Location: Right Arm) Comment: Forearm  Pulse 93   Temp 98.3 F (36.8 C) (Oral)   Ht 5\' 7"  (1.702 m)   Wt (!) 140.8 kg   LMP 02/25/2019   SpO2 98%   BMI 48.62 kg/m  If your blood pressure (BP) was elevated above 135/85 this visit, please have this repeated by your doctor within one month. --------------

## 2019-07-03 ENCOUNTER — Encounter (HOSPITAL_BASED_OUTPATIENT_CLINIC_OR_DEPARTMENT_OTHER): Payer: Self-pay | Admitting: Emergency Medicine

## 2019-07-03 ENCOUNTER — Other Ambulatory Visit: Payer: Self-pay

## 2019-07-03 ENCOUNTER — Emergency Department (HOSPITAL_BASED_OUTPATIENT_CLINIC_OR_DEPARTMENT_OTHER)
Admission: EM | Admit: 2019-07-03 | Discharge: 2019-07-03 | Disposition: A | Discharge status: Home / Self Care | Payer: 59 | Attending: Emergency Medicine | Admitting: Emergency Medicine

## 2019-07-03 DIAGNOSIS — L02412 Cutaneous abscess of left axilla: Secondary | ICD-10-CM | POA: Diagnosis not present

## 2019-07-03 DIAGNOSIS — Z7722 Contact with and (suspected) exposure to environmental tobacco smoke (acute) (chronic): Secondary | ICD-10-CM | POA: Insufficient documentation

## 2019-07-03 DIAGNOSIS — L0291 Cutaneous abscess, unspecified: Secondary | ICD-10-CM | POA: Diagnosis present

## 2019-07-03 MED ORDER — SULFAMETHOXAZOLE-TRIMETHOPRIM 800-160 MG PO TABS: 1.0000 | ORAL_TABLET | Freq: Two times a day (BID) | ORAL | 0 refills | Status: AC

## 2019-07-03 MED ORDER — LIDOCAINE HCL (PF) 1 % IJ SOLN
5.0000 mL | Freq: Once | INTRAMUSCULAR | Status: AC
  Administered 2019-07-03: 5 mL
  Filled 2019-07-03: qty 5

## 2019-07-03 NOTE — ED Triage Notes (Signed)
Abscess under left arm.

## 2019-07-03 NOTE — Discharge Instructions (Signed)
Apply warm compresses to area for 20 minutes at a time at least 3 times daily. Take Motrin and Tylenol as needed as directed for pain. Take Bactrim as prescribed and complete the full course.  Follow-up with your primary care provider or return to ED in 2 days for packing removal and recheck.

## 2019-07-03 NOTE — ED Provider Notes (Signed)
[facility] EMERGENCY DEPARTMENT Provider Note   CSN: [id number] Arrival date & time: 07/03/19  [id number]     History Chief Complaint  Patient presents with  . Abscess    [name] is a 44 y.o. female.  44 year old female with complaint of left axillary abscess x4 days, not draining.  Similar episode 4 months ago, treated here with I&D and antibiotics and did well until this episode started.  No known history of MRSA, nondiabetic.  No other complaints or concerns.        Past Medical History:  Diagnosis Date  . Obesity     There are no problems to display for this patient.   Past Surgical History:  Procedure Laterality Date  . APPENDECTOMY    . BREAST SURGERY    . CESAREAN SECTION     X2  . TUBAL LIGATION       OB History   No obstetric history on file.     History reviewed. No pertinent family history.  Social History   Tobacco Use  . Smoking status: Passive Smoke Exposure - Never Smoker  . Smokeless tobacco: Never Used  Substance Use Topics  . Alcohol use: Yes    Comment: occ  . Drug use: No    Home Medications Prior to Admission medications   Medication Sig Start Date End Date Taking? Authorizing Provider  HYDROcodone-acetaminophen (NORCO/VICODIN) 5-325 MG tablet Take 1-2 tablets every 6 hours as needed for severe pain 03/11/19   Carlisle Cater, PA-C  sulfamethoxazole-trimethoprim (BACTRIM DS) 800-160 MG tablet Take 1 tablet by mouth 2 (two) times daily for 7 days. 07/03/19 07/10/19  Tacy Learn, PA-C    Allergies    Patient has no known allergies.  Review of Systems   Review of Systems  Constitutional: Negative for fever.  Musculoskeletal: Positive for myalgias.  Skin: Positive for color change. Negative for wound.  Allergic/Immunologic: Negative for immunocompromised state.  Neurological: Negative for weakness and numbness.  Hematological: Negative for adenopathy.    Physical Exam Updated Vital Signs BP (!) 158/101 (BP  Location: Right Arm)   Pulse 84   Temp 99.1 F (37.3 C) (Oral)   Resp 18   SpO2 98%   Physical Exam Vitals and nursing note reviewed.  Constitutional:      General: She is not in acute distress.    Appearance: She is well-developed. She is not diaphoretic.  HENT:     Head: Normocephalic and atraumatic.  Cardiovascular:     Pulses: Normal pulses.  Pulmonary:     Effort: Pulmonary effort is normal.  Musculoskeletal:        General: Tenderness present.  Skin:    General: Skin is warm and dry.     Findings: Erythema present.       Neurological:     Mental Status: She is alert and oriented to person, place, and time.  Psychiatric:        Behavior: Behavior normal.     ED Results / Procedures / Treatments   Labs (all labs ordered are listed, but only abnormal results are displayed) Labs Reviewed - No data to display  EKG None  Radiology No results found.  Procedures .Marland KitchenIncision and Drainage  Date/Time: 07/03/2019 9:30 AM Performed by: Tacy Learn, PA-C Authorized by: Tacy Learn, PA-C   Consent:    Consent obtained:  Verbal   Consent given by:  Patient   Risks discussed:  Bleeding, incomplete drainage, pain and damage to other  organs   Alternatives discussed:  No treatment Universal protocol:    Procedure explained and questions answered to patient or proxy's satisfaction: yes     Relevant documents present and verified: yes     Test results available and properly labeled: yes     Imaging studies available: yes     Required blood products, implants, devices, and special equipment available: yes     Site/side marked: yes     Immediately prior to procedure a time out was called: yes     Patient identity confirmed:  Verbally with patient Location:    Type:  Abscess   Size:  5 cm x 4 cm   Location: Left axilla. Pre-procedure details:    Skin preparation:  Betadine Anesthesia (see MAR for exact dosages):    Anesthesia method:  Local infiltration    Local anesthetic:  Lidocaine 1% w/o epi Procedure type:    Complexity:  Complex Procedure details:    Incision types:  Single straight   Incision depth:  Subcutaneous   Scalpel blade:  11   Wound management:  Probed and deloculated, irrigated with saline and extensive cleaning   Drainage:  Purulent   Drainage amount:  Copious   Packing materials:  1/4 in iodoform gauze   Amount 1/4" iodoform:  2" Post-procedure details:    Patient tolerance of procedure:  Tolerated well, no immediate complications   (including critical care time)  Medications Ordered in ED Medications  lidocaine (PF) (XYLOCAINE) 1 % injection 5 mL (5 mLs Infiltration Given by Other 07/03/19 [id number])    ED Course  I have reviewed the triage vital signs and the nursing notes.  Pertinent labs & imaging results that were available during my care of the patient were reviewed by me and considered in my medical decision making (see chart for details).  Clinical Course as of Jul 02 1001  Wed Jul 02, 5812  1323 44 year old female with left axillary abscess described above.  I&D successful with packing placed.  Patient discharged on Bactrim, recommend warm compresses, recommend recheck in 2 days for packing removal.  Discussed importance of follow-up with PCP, routine breast exams and screening.   [LM]    Clinical Course User Index [LM] Roque Lias   MDM Rules/Calculators/A&P                      Final Clinical Impression(s) / ED Diagnoses Final diagnoses:  Abscess of left axilla    Rx / DC Orders ED Discharge Orders         Ordered    sulfamethoxazole-trimethoprim (BACTRIM DS) 800-160 MG tablet  2 times daily     07/03/19 0928           Tacy Learn, PA-C 07/03/19 1003    [place], [place], DO 07/03/19 1247

## 2019-10-03 ENCOUNTER — Emergency Department (HOSPITAL_BASED_OUTPATIENT_CLINIC_OR_DEPARTMENT_OTHER)
Admission: EM | Admit: 2019-10-03 | Discharge: 2019-10-03 | Disposition: A | Discharge status: Home / Self Care | Payer: 59 | Attending: Emergency Medicine | Admitting: Emergency Medicine

## 2019-10-03 ENCOUNTER — Other Ambulatory Visit: Payer: Self-pay

## 2019-10-03 ENCOUNTER — Encounter (HOSPITAL_BASED_OUTPATIENT_CLINIC_OR_DEPARTMENT_OTHER): Payer: Self-pay

## 2019-10-03 DIAGNOSIS — M7989 Other specified soft tissue disorders: Secondary | ICD-10-CM

## 2019-10-03 DIAGNOSIS — M79641 Pain in right hand: Secondary | ICD-10-CM | POA: Diagnosis not present

## 2019-10-03 DIAGNOSIS — Z7722 Contact with and (suspected) exposure to environmental tobacco smoke (acute) (chronic): Secondary | ICD-10-CM | POA: Diagnosis not present

## 2019-10-03 DIAGNOSIS — R2242 Localized swelling, mass and lump, left lower limb: Secondary | ICD-10-CM | POA: Insufficient documentation

## 2019-10-03 DIAGNOSIS — M79642 Pain in left hand: Secondary | ICD-10-CM

## 2019-10-03 MED ORDER — NAPROXEN 500 MG PO TABS: 500.0000 mg | ORAL_TABLET | Freq: Two times a day (BID) | ORAL | 0 refills | Status: AC

## 2019-10-03 NOTE — ED Notes (Signed)
Pt to bathroom with steady gait.

## 2019-10-03 NOTE — Discharge Instructions (Addendum)
I suspect that your symptoms today are related to carpal tunnel.  You are given a prescription for an anti-inflammatory to help with this.  You are also given bilateral wrist splints to help with your symptoms.  You can follow-up with either your regular doctor or with the orthopedic provider as placed in your discharge paperwork.  Just call the office schedule an appointment for follow-up.  Additionally, I have ordered an outpatient ultrasound of your left leg to rule out a blood clot for 5PM tomorrow.  Please follow-up accordingly to have this ultrasound completed.  Please return to the emergency department for any new or worsening symptoms.

## 2019-10-03 NOTE — ED Provider Notes (Signed)
[facility] EMERGENCY DEPARTMENT Provider Note   CSN: [id number] Arrival date & time: 10/03/19  1022     History Chief Complaint  Patient presents with  . Leg Swelling    [name] is a 44 y.o. female.  HPI   Pt is a 44 y/o female with a history of appendectomy, breast surgery, C-section, tubal ligation, who presents emergency department today for evaluation of multiple complaints.  Patient states that she has had a bilateral hand pain mostly in the thumbs.  States that hands feel tight and she has pain when she closes her fists.  She denies any overt swelling to the hands or numbness.  States she has had similar symptoms in the past intermittently.  States that she works at a [employer] all day for her job she is not sure if this is related.  She denies any recent injuries or falls.  Additionally she states that yesterday she noticed her left foot was swollen and her left leg felt tight.  States symptoms have improved today and she denies any left lower extremity pain or other associated symptoms. Denies hemoptysis, recent surgery/trauma, recent long travel, hormone use, personal hx of cancer, or hx of DVT/PE.   She denies any chest pain, shortness of breath, abdominal pain, nausea, vomiting, diarrhea, abdominal distention or fevers.   Past Medical History:  Diagnosis Date  . Obesity     There are no problems to display for this patient.   Past Surgical History:  Procedure Laterality Date  . APPENDECTOMY    . BREAST SURGERY    . CESAREAN SECTION     X2  . TUBAL LIGATION       OB History   No obstetric history on file.     No family history on file.  Social History   Tobacco Use  . Smoking status: Passive Smoke Exposure - Never Smoker  . Smokeless tobacco: Never Used  Substance Use Topics  . Alcohol use: Yes    Comment: occ  . Drug use: No    Home Medications Prior to Admission medications   Medication Sig Start Date End Date  Taking? Authorizing Provider  HYDROcodone-acetaminophen (NORCO/VICODIN) 5-325 MG tablet Take 1-2 tablets every 6 hours as needed for severe pain 03/11/19   Carlisle Cater, PA-C  naproxen (NAPROSYN) 500 MG tablet Take 1 tablet (500 mg total) by mouth 2 (two) times daily for 7 days. 10/03/19 10/10/19  Teriah Muela S, PA-C    Allergies    Patient has no known allergies.  Review of Systems   Review of Systems  Constitutional: Negative for chills and fever.  HENT: Negative for ear pain and sore throat.   Eyes: Negative for visual disturbance.  Respiratory: Negative for cough and shortness of breath.   Cardiovascular: Positive for leg swelling (improved). Negative for chest pain.  Gastrointestinal: Negative for abdominal pain, constipation, diarrhea, nausea and vomiting.  Genitourinary: Negative for dysuria and hematuria.  Musculoskeletal: Negative for back pain and neck pain.       Bilat hand pain/tightness  Skin: Negative for color change.  Neurological: Negative for weakness and numbness.  All other systems reviewed and are negative.   Physical Exam Updated Vital Signs BP (!) 138/96 (BP Location: Left Arm)   Pulse 84   Temp 98.5 F (36.9 C) (Oral)   Resp 18   Ht 5\' 8"  (1.727 m)   Wt (!) 142.3 kg   LMP 09/19/2019   SpO2 98%   BMI  47.71 kg/m   Physical Exam Vitals and nursing note reviewed.  Constitutional:      General: She is not in acute distress.    Appearance: She is well-developed.  HENT:     Head: Normocephalic and atraumatic.  Eyes:     Conjunctiva/sclera: Conjunctivae normal.  Cardiovascular:     Rate and Rhythm: Normal rate and regular rhythm.     Heart sounds: No murmur heard.   Pulmonary:     Effort: Pulmonary effort is normal. No respiratory distress.     Breath sounds: Normal breath sounds.  Abdominal:     General: There is no distension.     Palpations: Abdomen is soft.     Tenderness: There is no abdominal tenderness.  Musculoskeletal:     Cervical  back: Neck supple.     Comments: Trace ble edema. No obvious asymmetric swelling. No calf ttp. No obvious swelling to the bilat hands. Radial pulses 2+ symmetric. TTP over the bilat thenar eminences with +tinnels sign. Normal strength to the BUE and overall normal sensation with exception of decreased sensation over the left thenar eminence and left thumb   Skin:    General: Skin is warm and dry.  Neurological:     Mental Status: She is alert.     ED Results / Procedures / Treatments   Labs (all labs ordered are listed, but only abnormal results are displayed) Labs Reviewed - No data to display  EKG None  Radiology No results found.  Procedures Procedures (including critical care time)  Medications Ordered in ED Medications - No data to display  ED Course  I have reviewed the triage vital signs and the nursing notes.  Pertinent labs & imaging results that were available during my care of the patient were reviewed by me and considered in my medical decision making (see chart for details).    MDM Rules/Calculators/A&P                          44 year old female presenting for evaluation of bilateral hand pain/tightness and left lower extremity swelling.  Exam consistent with carpal tunnel of the bilateral upper extremities.  Will provide Rx for naproxen and give bilateral wrist splints.  Also gave information for Ortho follow-up.  With regard to left lower extremity swelling, she states symptoms have improved today.  She does not have any risk factors for VTE.  Normal distal pulses.  At this time, facility does not have ultrasound therefore outpatient ultrasound was ordered for tomorrow.  Patient will return tomorrow for this test.  Have advised close follow-up with PCP and/or orthopedics.  Have advised on specific return precautions.  She voices understanding of plan reasons to return.  Questions answered.  Patient stable for discharge.  Final Clinical Impression(s) / ED  Diagnoses Final diagnoses:  Bilateral hand pain  Swelling of left foot    Rx / DC Orders ED Discharge Orders         Ordered    VAS [place] LOWER EXTREMITY VENOUS (DVT)  Status:  Canceled        10/03/19 1312    US Venous Img Lower Unilateral Left (DVT)        10/03/19 1312    naproxen (NAPROSYN) 500 MG tablet  2 times daily        10/03/19 [address], [place], PA-C 10/03/19 1415    Sherwood Gambler, MD  10/04/19 0943  

## 2019-10-03 NOTE — ED Triage Notes (Signed)
Pt arrives with c/o swelling to legs and hands, reporting pain and difficulty bending fingers. Pt denies any history or medications daily.

## 2019-10-04 ENCOUNTER — Emergency Department (HOSPITAL_BASED_OUTPATIENT_CLINIC_OR_DEPARTMENT_OTHER)
Admit: 2019-10-04 | Discharge: 2019-10-04 | Disposition: A | Discharge status: Home / Self Care | Payer: 59 | Attending: Emergency Medicine | Admitting: Emergency Medicine

## 2020-05-06 ENCOUNTER — Emergency Department (HOSPITAL_BASED_OUTPATIENT_CLINIC_OR_DEPARTMENT_OTHER): Payer: 59

## 2020-05-06 ENCOUNTER — Emergency Department (HOSPITAL_BASED_OUTPATIENT_CLINIC_OR_DEPARTMENT_OTHER)
Admission: EM | Admit: 2020-05-06 | Discharge: 2020-05-06 | Disposition: A | Discharge status: Home / Self Care | Payer: 59 | Attending: Emergency Medicine | Admitting: Emergency Medicine

## 2020-05-06 ENCOUNTER — Encounter (HOSPITAL_BASED_OUTPATIENT_CLINIC_OR_DEPARTMENT_OTHER): Payer: Self-pay | Admitting: Emergency Medicine

## 2020-05-06 ENCOUNTER — Other Ambulatory Visit: Payer: Self-pay

## 2020-05-06 DIAGNOSIS — D509 Iron deficiency anemia, unspecified: Secondary | ICD-10-CM | POA: Insufficient documentation

## 2020-05-06 DIAGNOSIS — Z7722 Contact with and (suspected) exposure to environmental tobacco smoke (acute) (chronic): Secondary | ICD-10-CM | POA: Insufficient documentation

## 2020-05-06 DIAGNOSIS — M25512 Pain in left shoulder: Secondary | ICD-10-CM | POA: Insufficient documentation

## 2020-05-06 DIAGNOSIS — D75839 Thrombocytosis, unspecified: Secondary | ICD-10-CM

## 2020-05-06 LAB — CBC WITH DIFFERENTIAL/PLATELET
Abs Immature Granulocytes: 0.02 10*3/uL (ref 0.00–0.07)
Basophils Absolute: 0.1 10*3/uL (ref 0.0–0.1)
Basophils Relative: 1 %
Eosinophils Absolute: 0.2 10*3/uL (ref 0.0–0.5)
Eosinophils Relative: 2 %
HCT: 28.9 % — ABNORMAL LOW (ref 36.0–46.0)
Hemoglobin: 8.6 g/dL — ABNORMAL LOW (ref 12.0–15.0)
Immature Granulocytes: 0 %
Lymphocytes Relative: 19 %
Lymphs Abs: 1.8 10*3/uL (ref 0.7–4.0)
MCH: 21.2 pg — ABNORMAL LOW (ref 26.0–34.0)
MCHC: 29.8 g/dL — ABNORMAL LOW (ref 30.0–36.0)
MCV: 71.2 fL — ABNORMAL LOW (ref 80.0–100.0)
Monocytes Absolute: 1 10*3/uL (ref 0.1–1.0)
Monocytes Relative: 10 %
Neutro Abs: 6.7 10*3/uL (ref 1.7–7.7)
Neutrophils Relative %: 68 %
Platelets: 436 10*3/uL — ABNORMAL HIGH (ref 150–400)
RBC: 4.06 MIL/uL (ref 3.87–5.11)
RDW: 20.5 % — ABNORMAL HIGH (ref 11.5–15.5)
WBC: 9.8 10*3/uL (ref 4.0–10.5)
nRBC: 0 % (ref 0.0–0.2)

## 2020-05-06 MED ORDER — IBUPROFEN 400 MG PO TABS
400.0000 mg | ORAL_TABLET | Freq: Once | ORAL | Status: AC
Start: 2020-05-06 — End: 2020-05-06
  Administered 2020-05-06: 400 mg via ORAL
  Filled 2020-05-06: qty 1

## 2020-05-06 MED ORDER — PREDNISONE 50 MG PO TABS
60.0000 mg | ORAL_TABLET | Freq: Once | ORAL | Status: AC
  Administered 2020-05-06: 60 mg via ORAL
  Filled 2020-05-06: qty 1

## 2020-05-06 MED ORDER — PREDNISONE 50 MG PO TABS: 50.0000 mg | ORAL_TABLET | Freq: Every day | ORAL | 0 refills | Status: DC

## 2020-05-06 MED ORDER — FERROUS SULFATE 325 (65 FE) MG PO TABS: 325.0000 mg | ORAL_TABLET | Freq: Every day | ORAL | 0 refills | Status: DC

## 2020-05-06 MED ORDER — NAPROXEN 500 MG PO TABS: 500.0000 mg | ORAL_TABLET | Freq: Two times a day (BID) | ORAL | 0 refills | Status: DC

## 2020-05-06 MED ORDER — OXYCODONE HCL 5 MG PO TABS: 5.0000 mg | ORAL_TABLET | ORAL | 0 refills | Status: DC | PRN

## 2020-05-06 NOTE — ED Provider Notes (Signed)
[facility] EMERGENCY DEPARTMENT Provider Note   CSN: [id number] Arrival date & time: 05/06/20  0405   History Chief Complaint  Patient presents with  . Shoulder Pain    [name] is a 45 y.o. female.  The history is provided by the patient.  Shoulder Pain She had this evening at about 8:30 PM of severe pain in her left shoulder.  Pain is worse with any movement.  Pain is rated at 10/10.  She denies any trauma and denies any unusual activity.  There is no weakness or numbness.  Pain does radiate toward her neck.  She tried putting some muscle cream on it without any relief.  She also relates that she has a history of iron deficiency anemia and once her hemoglobin checked.  Past Medical History:  Diagnosis Date  . Obesity     There are no problems to display for this patient.   Past Surgical History:  Procedure Laterality Date  . APPENDECTOMY    . BREAST SURGERY    . CESAREAN SECTION     X2  . TUBAL LIGATION       OB History   No obstetric history on file.     No family history on file.  Social History   Tobacco Use  . Smoking status: Passive Smoke Exposure - Never Smoker  . Smokeless tobacco: Never Used  Substance Use Topics  . Alcohol use: Yes    Comment: occ  . Drug use: No    Home Medications Prior to Admission medications   Not on File    Allergies    Patient has no known allergies.  Review of Systems   Review of Systems  All other systems reviewed and are negative.   Physical Exam Updated Vital Signs BP (!) 163/113   Pulse 99   Temp 98.4 F (36.9 C) (Oral)   Resp 18   Ht 5\' 8"  (1.727 m)   Wt (!) 140.6 kg   SpO2 99%   BMI 47.14 kg/m   Physical Exam Vitals and nursing note reviewed.   45 year old female, resting comfortably and in no acute distress. Vital signs are significant for elevated blood pressure. Oxygen saturation is 99%, which is normal. Head is normocephalic and atraumatic. PERRLA, EOMI. Oropharynx is  clear. Neck is nontender and supple without adenopathy or JVD. Back is nontender and there is no CVA tenderness. Lungs are clear without rales, wheezes, or rhonchi. Chest is nontender. Heart has regular rate and rhythm without murmur. Abdomen is soft, flat, nontender without masses or hepatosplenomegaly and peristalsis is normoactive. Extremities: There is no swelling, erythema, or warmth.  There is marked tenderness to palpation in both the anterior and posterior deltoid grooves of the left shoulder.  No tenderness over the clavicle, no tenderness over the proximal humerus.  There is marked pain with any passive range of motion.  Distal neurovascular exam is intact with strong pulses, prompt capillary refill, normal sensation, normal strength of distal musculature.  Remainder of extremity exam is normal. Skin is warm and dry without rash. Neurologic: Mental status is normal, cranial nerves are intact, there are no motor or sensory deficits.  ED Results / Procedures / Treatments   Labs (all labs ordered are listed, but only abnormal results are displayed) Labs Reviewed  CBC WITH DIFFERENTIAL/PLATELET - Abnormal; Notable for the following components:      Result Value   Hemoglobin 8.6 (*)    HCT 28.9 (*)  MCV 71.2 (*)    MCH 21.2 (*)    MCHC 29.8 (*)    RDW 20.5 (*)    Platelets 436 (*)    All other components within normal limits   Radiology DG Shoulder Left  Result Date: 05/06/2020 CLINICAL DATA:  Left shoulder pain, no injury EXAM: LEFT SHOULDER - 2+ VIEW COMPARISON:  None. FINDINGS: There is no evidence of fracture or dislocation. There is no evidence of arthropathy or other focal bone abnormality. Soft tissues are unremarkable. IMPRESSION: Negative. Electronically Signed   By: Jacqulynn Cadet M.D.   On: 05/06/2020 05:08    Procedures Procedures   Medications Ordered in ED Medications  predniSONE (DELTASONE) tablet 60 mg (has no administration in time range)  ibuprofen  (ADVIL) tablet 400 mg (400 mg Oral Given 05/06/20 0439)    ED Course  I have reviewed the triage vital signs and the nursing notes.  Pertinent imaging results that were available during my care of the patient were reviewed by me and considered in my medical decision making (see chart for details).  MDM Rules/Calculators/A&P Left shoulder pain of uncertain cause.  No history of trauma.  Rapid onset argues against arthritis.  Consider crystal arthropathy.  Old records are reviewed, and she has no relevant past visits.  She is being sent for x-rays and she is given a dose of ibuprofen.  X-ray is unremarkable.  Hemoglobin is 8.6 with microcytic indices consistent with iron deficiency anemia.  Also, mild thrombocytosis is present of uncertain significance.  We will empirically try a course of prednisone to treat possible crystal arthropathy.  She is given a dose of prednisone and prescription for prednisone.  Also given prescription for naproxen, ferrous sulfate, and small number of oxycodone tablets.  She is referred to sports medicine for follow-up.  Final Clinical Impression(s) / ED Diagnoses Final diagnoses:  Acute pain of left shoulder  Microcytic anemia  Thrombocytosis    Rx / DC Orders ED Discharge Orders         Ordered    naproxen (NAPROSYN) 500 MG tablet  2 times daily        05/06/20 0552    predniSONE (DELTASONE) 50 MG tablet  Daily        05/06/20 0552    oxyCODONE (ROXICODONE) 5 MG immediate release tablet  Every 4 hours PRN        05/06/20 0552    ferrous sulfate 325 (65 FE) MG tablet  Daily        05/06/20 7564           Delora Fuel, MD 33/[birth year]/51 [phone]

## 2020-05-06 NOTE — ED Triage Notes (Signed)
Pt c/o pain from neck into left shoulder. Pt states she can't raise left arm. Denies known injury

## 2020-05-06 NOTE — Discharge Instructions (Signed)
Apply ice for 30 minutes at a time, 4 times a day.  You may add acetaminophen as needed for additional pain relief.  Follow-up with the sports medicine physician for further evaluation and treatment.

## 2020-12-04 ENCOUNTER — Other Ambulatory Visit: Payer: Self-pay

## 2020-12-04 ENCOUNTER — Emergency Department (HOSPITAL_BASED_OUTPATIENT_CLINIC_OR_DEPARTMENT_OTHER)
Admission: EM | Admit: 2020-12-04 | Discharge: 2020-12-04 | Disposition: A | Discharge status: Home / Self Care | Payer: 59 | Attending: Emergency Medicine | Admitting: Emergency Medicine

## 2020-12-04 ENCOUNTER — Encounter (HOSPITAL_BASED_OUTPATIENT_CLINIC_OR_DEPARTMENT_OTHER): Payer: Self-pay

## 2020-12-04 DIAGNOSIS — Z7722 Contact with and (suspected) exposure to environmental tobacco smoke (acute) (chronic): Secondary | ICD-10-CM | POA: Insufficient documentation

## 2020-12-04 DIAGNOSIS — M25512 Pain in left shoulder: Secondary | ICD-10-CM | POA: Diagnosis not present

## 2020-12-04 DIAGNOSIS — H9202 Otalgia, left ear: Secondary | ICD-10-CM | POA: Diagnosis present

## 2020-12-04 HISTORY — DX: Rheumatoid arthritis, unspecified: M06.9

## 2020-12-04 MED ORDER — IBUPROFEN 800 MG PO TABS: 800.0000 mg | ORAL_TABLET | Freq: Three times a day (TID) | ORAL | 0 refills | Status: DC | PRN

## 2020-12-04 MED ORDER — PREDNISONE 10 MG (21) PO TBPK: ORAL_TABLET | Freq: Every day | ORAL | 0 refills | Status: DC

## 2020-12-04 NOTE — ED Provider Notes (Signed)
Emergency Department Provider Note   I have reviewed the triage vital signs and the nursing notes.   HISTORY  Chief Complaint Otalgia and Shoulder Pain   HPI [name] is a 45 y.o. female presents to the ED with left ear pain and shoulder pain. Ear pain has been present for the last 2 days. Symptoms are moderate and worse with yawning. No bleeding or drainage from the ear. No injury. No HA. Patient has also developed left shoulder pain in the last 48 hours. Denies injury there as well. Feels similar to prior RA pain. No fever. No numbness. No neck pain.   Past Medical History:  Diagnosis Date   Obesity    Rheumatoid arthritis ([place])     There are no problems to display for this patient.   Past Surgical History:  Procedure Laterality Date   APPENDECTOMY     BREAST SURGERY     CESAREAN SECTION     X2   TUBAL LIGATION      Allergies Patient has no known allergies.  No family history on file.  Social History Social History   Tobacco Use   Smoking status: Never    Passive exposure: Yes   Smokeless tobacco: Never  Substance Use Topics   Alcohol use: Yes    Comment: occ   Drug use: No    Review of Systems  Constitutional: No fever/chills Eyes: No visual changes. ENT: No sore throat. Positive left ear pain.  Cardiovascular: Denies chest pain. Respiratory: Denies shortness of breath. Gastrointestinal: No abdominal pain.  No nausea, no vomiting.  No diarrhea.  No constipation. Genitourinary: Negative for dysuria. Musculoskeletal: Negative for back pain. Positive left shoulder pain.  Skin: Negative for rash. Neurological: Negative for headaches, focal weakness or numbness.  10-point ROS otherwise negative.  ____________________________________________   PHYSICAL EXAM:  VITAL SIGNS: ED Triage Vitals  Enc Vitals Group     BP 12/04/20 0830 (!) 156/106     Pulse Rate 12/04/20 0830 84     Resp 12/04/20 0830 14     Temp 12/04/20 0830 98.3 F  (36.8 C)     Temp Source 12/04/20 0830 Oral     SpO2 12/04/20 0830 97 %     Weight 12/04/20 0831 (!) 310 lb (140.6 kg)     Height 12/04/20 0831 5\' 8"  (1.727 m)    Constitutional: Alert and oriented. Well appearing and in no acute distress. Eyes: Conjunctivae are normal. PERRL. EOMI. Head: Atraumatic. Ears:  Healthy appearing ear canals and TMs bilaterally. No otitis externa. No drainage.  Nose: No congestion/rhinnorhea. Mouth/Throat: Mucous membranes are moist.   Neck: No stridor. No cervical spine tenderness to palpation. Cardiovascular: Normal rate, regular rhythm. Good peripheral circulation. Grossly normal heart sounds.   Respiratory: Normal respiratory effort.  No retractions. Lungs CTAB. Gastrointestinal: Soft and nontender. No distention.  Musculoskeletal: No lower extremity tenderness nor edema. No gross deformities of extremities. Normal ROM of the left shoulder. No bony tenderness or deformity. No warmth or redness to the joint.  Neurologic:  Normal speech and language. No gross focal neurologic deficits are appreciated.  Skin:  Skin is warm, dry and intact. No rash noted.  ____________________________________________  RADIOLOGY  None ____________________________________________   PROCEDURES  Procedure(s) performed:   Procedures  None ____________________________________________   INITIAL IMPRESSION / ASSESSMENT AND PLAN / ED COURSE  Pertinent labs & imaging results that were available during my care of the patient were reviewed by me and considered in my  medical decision making (see chart for details).   Patient presents to the ED with left ear pain. No OTM, otitis externa, malignant otitis, or mastoiditis on exam. Normal TM without perforation.   Left shoulder does not seem injury, fractured, dislocated. No evidence of joint effusion or septic joint. Do not see an indication for emergent imaging at this time. Pain is similar to RA pain from the past. Plan for  steroid course and NSAIDs.    ____________________________________________  FINAL CLINICAL IMPRESSION(S) / ED DIAGNOSES  Final diagnoses:  Acute pain of left shoulder  Left ear pain    NEW OUTPATIENT MEDICATIONS STARTED DURING THIS VISIT:  Discharge Medication List as of 12/04/2020  8:42 AM     START taking these medications   Details  ibuprofen (ADVIL) 800 MG tablet Take 1 tablet (800 mg total) by mouth every 8 (eight) hours as needed., Starting Fri 12/04/2020, Normal    predniSONE (STERAPRED UNI-PAK 21 TAB) 10 MG (21) TBPK tablet Take by mouth daily. Take 6 tabs by mouth daily  for 2 days, then 5 tabs for 2 days, then 4 tabs for 2 days, then 3 tabs for 2 days, 2 tabs for 2 days, then 1 tab by mouth daily for 2 days, Starting Fri 12/04/2020, Normal        Note:  This document was prepared using Dragon voice recognition software and may include unintentional dictation errors.  Nanda Quinton, MD, [facility] Emergency Medicine    Jael Kostick, [facility], MD 12/10/20 [phone]

## 2020-12-04 NOTE — Discharge Instructions (Signed)
You were seen today with left shoulder pain.  I suspect this may be a RA flare and starting on some steroid medications along with anti-inflammatories.  Please make your rheumatologist aware and follow closely with them along with your PCP.  Return with any fevers or sudden/severe symptoms.

## 2020-12-04 NOTE — ED Triage Notes (Signed)
Pt c/o L ear pain, exacerbated when yawning or sneezing x2 days. Denies difficulty hearing, no drainage per patient. Pt also c/o L shoulder pain since last night. Pt with hx of RA, states pain is similar to previous.

## 2021-03-10 ENCOUNTER — Other Ambulatory Visit: Payer: Self-pay

## 2021-03-11 ENCOUNTER — Ambulatory Visit: Payer: 59 | Admitting: Nurse Practitioner

## 2021-03-11 ENCOUNTER — Encounter: Payer: Self-pay | Admitting: Nurse Practitioner

## 2021-03-11 VITALS — BP 164/110 | HR 97 | Temp 96.7°F | Ht 68.0 in | Wt 294.2 lb

## 2021-03-11 DIAGNOSIS — R030 Elevated blood-pressure reading, without diagnosis of hypertension: Secondary | ICD-10-CM

## 2021-03-11 DIAGNOSIS — M2550 Pain in unspecified joint: Secondary | ICD-10-CM

## 2021-03-11 DIAGNOSIS — M0579 Rheumatoid arthritis with rheumatoid factor of multiple sites without organ or systems involvement: Secondary | ICD-10-CM

## 2021-03-11 DIAGNOSIS — Z13220 Encounter for screening for lipoid disorders: Secondary | ICD-10-CM | POA: Diagnosis not present

## 2021-03-11 DIAGNOSIS — Z136 Encounter for screening for cardiovascular disorders: Secondary | ICD-10-CM | POA: Diagnosis not present

## 2021-03-11 LAB — COMPREHENSIVE METABOLIC PANEL
ALT: 33 U/L (ref 0–35)
AST: 44 U/L — ABNORMAL HIGH (ref 0–37)
Albumin: 3.8 g/dL (ref 3.5–5.2)
Alkaline Phosphatase: 161 U/L — ABNORMAL HIGH (ref 39–117)
BUN: 11 mg/dL (ref 6–23)
CO2: 26 mEq/L (ref 19–32)
Calcium: 9.4 mg/dL (ref 8.4–10.5)
Chloride: 104 mEq/L (ref 96–112)
Creatinine, Ser: 0.64 mg/dL (ref 0.40–1.20)
GFR: 107.06 mL/min (ref >60.00)
Glucose, Bld: 90 mg/dL (ref 70–99)
Potassium: 3.8 mEq/L (ref 3.5–5.1)
Sodium: 138 mEq/L (ref 135–145)
Total Bilirubin: 0.9 mg/dL (ref 0.2–1.2)
Total Protein: 7.8 g/dL (ref 6.0–8.3)

## 2021-03-11 LAB — CBC WITH DIFFERENTIAL/PLATELET
Basophils Absolute: 0.1 10*3/uL (ref 0.0–0.1)
Basophils Relative: 1.3 % (ref 0.0–3.0)
Eosinophils Absolute: 0.1 10*3/uL (ref 0.0–0.7)
Eosinophils Relative: 1.2 % (ref 0.0–5.0)
HCT: 32.1 % — ABNORMAL LOW (ref 36.0–46.0)
Hemoglobin: 10 g/dL — ABNORMAL LOW (ref 12.0–15.0)
Lymphocytes Relative: 14.6 % (ref 12.0–46.0)
Lymphs Abs: 1.7 10*3/uL (ref 0.7–4.0)
MCHC: 31.3 g/dL (ref 30.0–36.0)
MCV: 81.5 fl (ref 78.0–100.0)
Monocytes Absolute: 1.1 10*3/uL — ABNORMAL HIGH (ref 0.1–1.0)
Monocytes Relative: 9.5 % (ref 3.0–12.0)
Neutro Abs: 8.3 10*3/uL — ABNORMAL HIGH (ref 1.4–7.7)
Neutrophils Relative %: 73.4 % (ref 43.0–77.0)
Platelets: 394 10*3/uL (ref 150.0–400.0)
RBC: 3.94 Mil/uL (ref 3.87–5.11)
RDW: 18.1 % — ABNORMAL HIGH (ref 11.5–15.5)
WBC: 11.4 10*3/uL — ABNORMAL HIGH (ref 4.0–10.5)

## 2021-03-11 LAB — VITAMIN B12: Vitamin B-12: 496 pg/mL (ref 211–911)

## 2021-03-11 LAB — LIPID PANEL
Cholesterol: 164 mg/dL (ref 0–200)
HDL: 57 mg/dL (ref >39.00)
LDL Cholesterol: 95 mg/dL (ref 0–99)
NonHDL: 107.33
Total CHOL/HDL Ratio: 3
Triglycerides: 64 mg/dL (ref 0.0–149.0)
VLDL: 12.8 mg/dL (ref 0.0–40.0)

## 2021-03-11 LAB — TSH: TSH: 1.04 u[IU]/mL (ref 0.35–5.50)

## 2021-03-11 MED ORDER — PREDNISONE 10 MG PO TABS: 10.0000 mg | ORAL_TABLET | Freq: Every day | ORAL | 2 refills | Status: DC

## 2021-03-11 NOTE — Assessment & Plan Note (Signed)
Chronic, not controlled. She is not happy with her current rheumatologist. New referral placed. Will re-start prednisone 10mg  daily as this dose help her symptoms. She has been out of methotrexate for 2 weeks, however didn't notice a difference while taking it. Check CMP, CBC today. Follow up in 4 weeks.

## 2021-03-11 NOTE — Assessment & Plan Note (Addendum)
Blood pressure elevated today. Encouraged her to start checking her blood pressure daily and writing it down at home. Unsure if BP is elevated due to severe pain today. Discussed limiting salt in her diet. Check CMP, CBC, TSH. Follow up in 4 weeks.

## 2021-03-11 NOTE — Progress Notes (Signed)
New Patient Office Visit  Subjective:  Patient ID: [name], female    DOB: 17-Jan-1976  Age: 46 y.o. MRN: [id number]  CC:  Chief Complaint  Patient presents with   Establish Care    Np. Est care. Pt c/o pain in bpth hands, knees, shoulders and ankles x3 months w/ swelling in both hands and feet    HPI [name] presents for new patient visit to establish care.  Introduced to Designer, [employer]ellery role and practice setting.  All questions answered.  Discussed provider/patient relationship and expectations.  She was diagnosed with rheumatoid arthritis in July 2022. She saw Dr. Tana Coast with rheumatoid arthritis who started her on low dose prednisone and methotrexate. She has not had any improvement in her symptoms since starting the methotrexate. She says that prednisone 10mg  daily really helps with the pain. She would like a referral to a different rheumatologist. Over the past week, she has noticed an increase in swelling in her hands. She has very limited movement in her left shoulder and elbow.   ARTHRALGIAS / JOINT ACHES  Duration: months Pain: yes Symmetric: yes  9/10 Quality: throbbing, tight Frequency: constant Context:  worse Decreased function/range of motion: Yes Erythema: No Swelling: Yes Heat or warmth: No Morning stiffness: No Aggravating factors: No Alleviating factors: prednisone Relief with NSAIDs?: mild- when pain isn't bad it works better Treatments attempted: ibuprofen, prednisone, hydrocodone  Involved Joints:     Hands: yes bilateral    Wrists: yes bilateral     Elbows: yes bilateral    Shoulders: yes bilateral    Back: no     Hips: yes bilateral    Knees: yes bilateral    Ankles: yes bilateral    Feet: yes bilateral   Past Medical History:  Diagnosis Date   Obesity    Rheumatoid arthritis ([place])     Past Surgical History:  Procedure Laterality Date   APPENDECTOMY     BREAST SURGERY     CESAREAN SECTION     X2   TUBAL  LIGATION      Family History  Problem Relation Age of Onset   Hypertension Mother    Diabetes Mother    Cancer Mother        bone marrow, breast   Cancer Father        prostate   Cancer Paternal Uncle        prostate cancer   Cancer Paternal Grandfather        prostate cancer    Social History   Socioeconomic History   Marital status: Married    Spouse name: Not on file   Number of children: Not on file   Years of education: Not on file   Highest education level: Not on file  Occupational History   Not on file  Tobacco Use   Smoking status: Never    Passive exposure: Yes   Smokeless tobacco: Never  Vaping Use   Vaping Use: Never used  Substance and Sexual Activity   Alcohol use: Yes    Comment: occ   Drug use: No   Sexual activity: Yes    Birth control/protection: Surgical  Other Topics Concern   Not on file  Social History Narrative   Not on file   Social Determinants of Health   Financial Resource Strain: Not on file  Food Insecurity: Not on file  Transportation Needs: Not on file  Physical Activity: Not on file  Stress: Not on file  Social Connections: Not on file  Intimate Partner Violence: Not on file    ROS Review of Systems  Constitutional:  Positive for fatigue.  HENT: Negative.    Respiratory: Negative.    Cardiovascular: Negative.   Gastrointestinal: Negative.   Genitourinary: Negative.   Musculoskeletal:  Positive for arthralgias.       With swelling to hands and wrists  Skin: Negative.   Neurological: Negative.   Psychiatric/Behavioral:         Increase stress at home with recent deaths in family and cancer diagnoses. She states it is not interfering with daily life and she is able to control it.    Objective:   Today's Vitals: BP (!) 164/110 (BP Location: Right Arm) Comment (Cuff Size): XXL   Pulse 97    Temp (!) 96.7 F (35.9 C) (Temporal)    Ht 5\' 8"  (1.727 m)    Wt 294 lb 3.2 oz (133.4 kg)    SpO2 97%    BMI 44.73 kg/m    Physical Exam Vitals and nursing note reviewed.  Constitutional:      General: She is not in acute distress.    Appearance: Normal appearance. She is obese.  HENT:     Head: Normocephalic.  Eyes:     Conjunctiva/sclera: Conjunctivae normal.  Cardiovascular:     Rate and Rhythm: Normal rate and regular rhythm.     Pulses: Normal pulses.     Heart sounds: Normal heart sounds.  Pulmonary:     Effort: Pulmonary effort is normal.     Breath sounds: Normal breath sounds.  Musculoskeletal:        General: Swelling present. No tenderness.     Cervical back: Normal range of motion.     Comments: Limited ROM to left shoulder and elbow. Swelling noted to bilateral hands  Skin:    General: Skin is warm.     Capillary Refill: Capillary refill takes less than 2 seconds.  Neurological:     General: No focal deficit present.     Mental Status: She is alert and oriented to person, place, and time.  Psychiatric:        Mood and Affect: Mood normal.        Behavior: Behavior normal.        Thought Content: Thought content normal.        Judgment: Judgment normal.    Assessment & Plan:   Problem List Items Addressed This Visit       Musculoskeletal and Integument   Rheumatoid arthritis involving multiple sites with positive rheumatoid factor (HCC) - Primary    Chronic, not controlled. She is not happy with her current rheumatologist. New referral placed. Will re-start prednisone 10mg  daily as this dose help her symptoms. She has been out of methotrexate for 2 weeks, however didn't notice a difference while taking it. Check CMP, CBC today. Follow up in 4 weeks.       Relevant Medications   methotrexate (RHEUMATREX) 2.5 MG tablet   predniSONE (DELTASONE) 10 MG tablet   Other Relevant Orders   CBC with Differential/Platelet   Comprehensive metabolic panel   Ambulatory referral to Rheumatology     Other   Elevated blood pressure reading    Blood pressure elevated today. Encouraged  her to start checking her blood pressure daily and writing it down at home. Unsure if BP is elevated due to severe pain today. Discussed limiting salt in her diet. Check CMP, CBC, TSH. Follow up in  4 weeks.      Relevant Orders   CBC with Differential/Platelet   Comprehensive metabolic panel   TSH   Morbid obesity (HCC)    BMI 44.7 . Will discuss further at next visit.       Other Visit Diagnoses     Encounter for lipid screening for cardiovascular disease       Check lipid panel today and treat based on results   Relevant Orders   Lipid panel   Arthralgia, unspecified joint       Currently being treated for RA. See plan for RA. Will also check vitamin B12 level   Relevant Orders   Vitamin B12       Outpatient Encounter Medications as of 03/11/2021  Medication Sig   predniSONE (DELTASONE) 10 MG tablet Take 1 tablet (10 mg total) by mouth daily with breakfast.   ibuprofen (ADVIL) 800 MG tablet Take 1 tablet (800 mg total) by mouth every 8 (eight) hours as needed.   methotrexate (RHEUMATREX) 2.5 MG tablet Take 15 mg by mouth once a week.   [DISCONTINUED] ferrous sulfate 325 (65 FE) MG tablet Take 1 tablet (325 mg total) by mouth daily.   [DISCONTINUED] naproxen (NAPROSYN) 500 MG tablet Take 1 tablet (500 mg total) by mouth 2 (two) times daily. (Patient not taking: Reported on 03/11/2021)   [DISCONTINUED] oxyCODONE (ROXICODONE) 5 MG immediate release tablet Take 1 tablet (5 mg total) by mouth every 4 (four) hours as needed for severe pain. (Patient not taking: Reported on 03/11/2021)   [DISCONTINUED] predniSONE (STERAPRED UNI-PAK 21 TAB) 10 MG (21) TBPK tablet Take by mouth daily. Take 6 tabs by mouth daily  for 2 days, then 5 tabs for 2 days, then 4 tabs for 2 days, then 3 tabs for 2 days, 2 tabs for 2 days, then 1 tab by mouth daily for 2 days (Patient not taking: Reported on 03/11/2021)   No facility-administered encounter medications on file as of 03/11/2021.    Follow-up: Return in  about 4 weeks (around 04/08/2021) for CPE.   Latoya Dancer, NP

## 2021-03-11 NOTE — Patient Instructions (Signed)
It was great to see you!  Start prednisone 10mg  daily. I am placing a referral to rheumatology and they will call to schedule an appointment.   Let's follow-up in 4 weeks, sooner if you have concerns.  If a referral was placed today, you will be contacted for an appointment. Please note that routine referrals can sometimes take up to 3-4 weeks to process. Please call our office if you haven't heard anything after this time frame.  Take care,  Vance Peper, NP

## 2021-03-11 NOTE — Assessment & Plan Note (Addendum)
BMI 44.7 . Will discuss further at next visit.

## 2021-03-15 ENCOUNTER — Telehealth: Payer: Self-pay | Admitting: Nurse Practitioner

## 2021-03-15 NOTE — Telephone Encounter (Signed)
Results printed and faxed to [phone]

## 2021-03-15 NOTE — Telephone Encounter (Signed)
[name] is needing pt's most recent lab results faxed to [phone].

## 2021-03-29 ENCOUNTER — Ambulatory Visit: Payer: 59 | Admitting: Nurse Practitioner

## 2021-04-05 NOTE — Progress Notes (Signed)
BP (!) 148/100 (BP Location: Right Wrist, Cuff Size: Normal)    Pulse 77    Temp (!) 97 F (36.1 C) (Temporal)    Wt 296 lb 3.2 oz (134.4 kg)    SpO2 100%    BMI 45.04 kg/m    Subjective:    Patient ID: [name], female    DOB: 1976-01-14, 46 y.o.   MRN: [id number]  CC: Chief Complaint  Patient presents with   Annual Exam    CPE w/ pap   HPI: [name] is a 46 y.o. female presenting on 04/08/2021 for comprehensive medical examination. Current medical complaints include: missed period for 3 months and a bump on her left breast.  She has not had her period in 3 months. She endorses that they were slightly irregular before and some months were just spotting. She has a history of tubal ligation and has not been sexually active. She endorses hot flashes, more often at night. She is wondering if she may be going through menopause.   She has also noticed a bump on her left breast that is sore since Saturday. It is red, and feels like a knot. Now it has a scab over it. Denies drainage, fevers. It does not hurt anymore.   She currently lives with: husband Menopausal Symptoms: yes - hot flashes, no period 3 months  Depression Screen done today and results listed below:  Depression screen [facility] 2/9 04/08/2021  Decreased Interest 0  Down, Depressed, Hopeless 0  PHQ - 2 Score 0  Altered sleeping 0  Tired, decreased energy 0  Change in appetite 0  Feeling bad or failure about yourself  0  Trouble concentrating 0  Moving slowly or fidgety/restless 0  Suicidal thoughts 0  PHQ-9 Score 0  Difficult doing work/chores Not difficult at all    The patient does not have a history of falls. I did not complete a risk assessment for falls. A plan of care for falls was not documented.   Past Medical History:  Past Medical History:  Diagnosis Date   Obesity    Rheumatoid arthritis ([place])     Surgical History:  Past Surgical History:  Procedure Laterality Date   APPENDECTOMY      BREAST SURGERY     CESAREAN SECTION     X2   TUBAL LIGATION      Medications:  Current Outpatient Medications on Soto Prior to Visit  Medication Sig   ibuprofen (ADVIL) 800 MG tablet Take 1 tablet (800 mg total) by mouth every 8 (eight) hours as needed.   methotrexate (RHEUMATREX) 2.5 MG tablet Take 15 mg by mouth once a week.   predniSONE (DELTASONE) 10 MG tablet Take 1 tablet (10 mg total) by mouth daily with breakfast.   No current facility-administered medications on Soto prior to visit.    Allergies:  No Known Allergies  Social History:  Social History   Socioeconomic History   Marital status: Married    Spouse name: Not on Soto   Number of children: Not on Soto   Years of education: Not on Soto   Highest education level: Not on Soto  Occupational History   Not on Soto  Tobacco Use   Smoking status: Never    Passive exposure: Yes   Smokeless tobacco: Never  Vaping Use   Vaping Use: Never used  Substance and Sexual Activity   Alcohol use: Yes    Comment: occ   Drug use: No  Sexual activity: Yes    Birth control/protection: Surgical  Other Topics Concern   Not on Soto  Social History Narrative   Not on Soto   Social Determinants of Health   Financial Resource Strain: Not on Soto  Food Insecurity: Not on Soto  Transportation Needs: Not on Soto  Physical Activity: Not on Soto  Stress: Not on Soto  Social Connections: Not on Soto  Intimate Partner Violence: Not on Soto   Social History   Tobacco Use  Smoking Status Never   Passive exposure: Yes  Smokeless Tobacco Never   Social History   Substance and Sexual Activity  Alcohol Use Yes   Comment: occ    Family History:  Family History  Problem Relation Age of Onset   Hypertension Mother    Diabetes Mother    Cancer Mother        bone marrow, breast   Cancer Father        prostate   Cancer Paternal Uncle        prostate cancer   Cancer Paternal Grandfather        prostate cancer     Past medical history, surgical history, medications, allergies, family history and social history reviewed with patient today and changes made to appropriate areas of the chart.   Review of Systems  Constitutional: Negative.   HENT: Negative.    Eyes: Negative.   Respiratory: Negative.    Cardiovascular: Negative.   Gastrointestinal: Negative.   Genitourinary:  Positive for frequency and urgency. Negative for dysuria and hematuria.  Musculoskeletal: Negative.   Skin:        Bump on left breast, see HPI   Neurological: Negative.   Psychiatric/Behavioral: Negative.    All other ROS negative except what is listed above and in the HPI.      Objective:    BP (!) 148/100 (BP Location: Right Wrist, Cuff Size: Normal)    Pulse 77    Temp (!) 97 F (36.1 C) (Temporal)    Wt 296 lb 3.2 oz (134.4 kg)    SpO2 100%    BMI 45.04 kg/m   Wt Readings from Last 3 Encounters:  04/08/21 296 lb 3.2 oz (134.4 kg)  03/11/21 294 lb 3.2 oz (133.4 kg)  12/04/20 (!) 310 lb (140.6 kg)    Physical Exam Vitals and nursing note reviewed. Exam conducted with a chaperone present.  Constitutional:      General: She is not in acute distress.    Appearance: Normal appearance.  HENT:     Head: Normocephalic and atraumatic.     Right Ear: Tympanic membrane, ear canal and external ear normal.     Left Ear: Tympanic membrane, ear canal and external ear normal.     Nose: Nose normal.     Mouth/Throat:     Mouth: Mucous membranes are moist.     Pharynx: Oropharynx is clear.  Eyes:     Conjunctiva/sclera: Conjunctivae normal.  Cardiovascular:     Rate and Rhythm: Normal rate and regular rhythm.     Pulses: Normal pulses.     Heart sounds: Normal heart sounds.  Pulmonary:     Effort: Pulmonary effort is normal.     Breath sounds: Normal breath sounds.  Chest:  Breasts:    Right: Normal.     Left: Normal.     Comments: Scars noted from prior breast reduction surgery bilaterally Abdominal:      General: Bowel sounds are normal.  Palpations: Abdomen is soft.     Tenderness: There is no abdominal tenderness.  Genitourinary:    Exam position: Lithotomy position.     Labia:        Right: No rash, tenderness or lesion.        Left: No rash, tenderness or lesion.      Vagina: Normal.     Cervix: Normal.     Uterus: Normal.      Adnexa: Right adnexa normal and left adnexa normal.  Musculoskeletal:        General: Normal range of motion.     Cervical back: Normal range of motion. No tenderness.     Right lower leg: No edema.     Left lower leg: No edema.  Lymphadenopathy:     Cervical: No cervical adenopathy.  Skin:    General: Skin is warm and dry.     Comments: Healing abscess to left breast near areola. No drainage, signs of infection  Neurological:     General: No focal deficit present.     Mental Status: She is alert and oriented to person, place, and time.     Cranial Nerves: No cranial nerve deficit.     Coordination: Coordination normal.     Gait: Gait normal.  Psychiatric:        Mood and Affect: Mood normal.        Behavior: Behavior normal.        Thought Content: Thought content normal.        Judgment: Judgment normal.    Results for orders placed or performed in visit on 04/08/21  POCT urinalysis dipstick  Result Value Ref Range   Color, UA yellow    Clarity, UA cloudy    Glucose, UA Negative Negative   Bilirubin, UA negative    Ketones, UA negative    Spec Grav, UA 1.025 1.010 - 1.025   Blood, UA 1+    pH, UA 6.0 5.0 - 8.0   Protein, UA Positive (A) Negative   Urobilinogen, UA 0.2 0.2 or 1.0 E.U./dL   Nitrite, UA negative    Leukocytes, UA Negative Negative   Appearance     Odor        Assessment & Plan:   Problem List Items Addressed This Visit       Cardiovascular and Mediastinum   Primary hypertension    New onset hypertension. Blood pressure was elevated last visit and during readings at home. Will start amlodipine 5 mg daily, if  after 7 days she is not below 140/90, she can start '10mg'$  daily. Discussed limiting salt in her diet. CMP, CBC reviewed from last visit. Keep checking blood pressure daily at home and writing it down. Follow up in 4 weeks.       Relevant Medications   amLODipine (NORVASC) 5 MG tablet     Musculoskeletal and Integument   Rheumatoid arthritis involving multiple sites with positive rheumatoid factor (HCC)    Chronic, stable. She has been following with rheumatology and her symptoms have improved significantly from last visit. She is taking methotrexate weekly, prednisone daily, and getting an infusion that she doesn't remember the name of. She has no swelling or pain in her joints today, and she has full ROM. Continue collaboration and recommendations from rheumatology.         Other   Morbid obesity ([place])    BMI 45. Discussed diet and exercise. She currently takes the stairs at work instead of the [employer]  and tries to walk when able. Goal is to lose 1-2 pounds per week.       Anemia    Hemoglobin 10 last visit 4 weeks ago. She has a history of heavy menstrual periods with large clots. Will repeat CBC and check iron panel today.       Relevant Orders   CBC with Differential/Platelet   Iron, TIBC and Ferritin Panel   Elevated LFTs    Alkaline phosphatase and AST slightly elevated last month. Will repeat LFTs today.       Relevant Orders   Hepatic function panel   Perimenopause    She has missed 3 menstrual periods and before that she was alternating spotting with regular periods. She also endorses hot flashes. Discussed when menopause starts and to follow-up if hot flashes worsen or with any concerns.       Other Visit Diagnoses     Routine general medical examination at a health care facility    -  Primary   Health maintenance reviewed and updated. CMP, CBC reviewed from last visit. Td and flu vaccine updated. Pap done. Mammogram ordered.    Urinary frequency       Relevant  Orders   Hemoglobin A1c   POCT urinalysis dipstick (Completed)   Boil       Healing abscess to left breast. No signs of infection. Can use warm compresses as needed for pain.   Screening for cervical cancer       Pap with HPV done today   Relevant Orders   Cytology - PAP   Screen for colon cancer       Referral placed to GI for colon cancer screening   Relevant Orders   Ambulatory referral to Gastroenterology   Encounter for screening mammogram for malignant neoplasm of breast       Mammogram ordered today   Relevant Orders   MM 3D SCREEN BREAST BILATERAL   Need for Td vaccine       Tetanus booster updated today   Relevant Orders   Td vaccine greater than or equal to 7yo preservative free IM (Completed)   Need for influenza vaccination       Flu vaccine given today   Relevant Orders   Flu Vaccine QUAD 63moIM (Fluarix, Fluzone & Alfiuria Quad PF) (Completed)        Follow up plan: Return in about 4 weeks (around 05/06/2021) for HTN.   LABORATORY TESTING:  - Pap smear: pap done  IMMUNIZATIONS:   - Tdap: Tetanus vaccination status reviewed: Td vaccination indicated and given today. - Influenza: Administered today - Pneumovax: Not applicable - Prevnar: Not applicable - HPV: Not applicable - Zostavax vaccine: Not applicable  SCREENING: -Mammogram: Ordered today  - Colonoscopy: Ordered today  - Bone Density: Not applicable  -Hearing Test: Not applicable  -Spirometry: Not applicable   PATIENT COUNSELING:   Advised to take 1 mg of folate supplement per day if capable of pregnancy.   Sexuality: Discussed sexually transmitted diseases, partner selection, use of condoms, avoidance of unintended pregnancy  and contraceptive alternatives.   Advised to avoid cigarette smoking.  I discussed with the patient that most people either abstain from alcohol or drink within safe limits (<=14/week and <=4 drinks/occasion for males, <=7/weeks and <= 3 drinks/occasion for females) and  that the risk for alcohol disorders and other health effects rises proportionally with the number of drinks per week and how often a drinker exceeds daily limits.  Discussed cessation/primary prevention  of drug use and availability of treatment for abuse.   Diet: Encouraged to adjust caloric intake to maintain  or achieve ideal body weight, to reduce intake of dietary saturated fat and total fat, to limit sodium intake by avoiding high sodium foods and not adding table salt, and to maintain adequate dietary potassium and calcium preferably from fresh fruits, vegetables, and low-fat dairy products.    stressed the importance of regular exercise  Injury prevention: Discussed safety belts, safety helmets, smoke detector, smoking near bedding or upholstery.   Dental health: Discussed importance of regular tooth brushing, flossing, and dental visits.    NEXT PREVENTATIVE PHYSICAL DUE IN 1 YEAR. Return in about 4 weeks (around 05/06/2021) for HTN.

## 2021-04-08 ENCOUNTER — Ambulatory Visit (INDEPENDENT_AMBULATORY_CARE_PROVIDER_SITE_OTHER): Payer: 59 | Admitting: Nurse Practitioner

## 2021-04-08 ENCOUNTER — Other Ambulatory Visit (HOSPITAL_COMMUNITY)
Admission: RE | Admit: 2021-04-08 | Discharge: 2021-04-08 | Disposition: A | Discharge status: Home / Self Care | Payer: 59 | Source: Ambulatory Visit | Attending: Nurse Practitioner | Admitting: Nurse Practitioner

## 2021-04-08 ENCOUNTER — Encounter: Payer: Self-pay | Admitting: Nurse Practitioner

## 2021-04-08 ENCOUNTER — Other Ambulatory Visit: Payer: Self-pay

## 2021-04-08 VITALS — BP 148/100 | HR 77 | Temp 97.0°F | Wt 296.2 lb

## 2021-04-08 DIAGNOSIS — L0292 Furuncle, unspecified: Secondary | ICD-10-CM

## 2021-04-08 DIAGNOSIS — R350 Frequency of micturition: Secondary | ICD-10-CM | POA: Diagnosis not present

## 2021-04-08 DIAGNOSIS — D649 Anemia, unspecified: Secondary | ICD-10-CM

## 2021-04-08 DIAGNOSIS — Z124 Encounter for screening for malignant neoplasm of cervix: Secondary | ICD-10-CM

## 2021-04-08 DIAGNOSIS — I10 Essential (primary) hypertension: Secondary | ICD-10-CM | POA: Diagnosis not present

## 2021-04-08 DIAGNOSIS — Z1231 Encounter for screening mammogram for malignant neoplasm of breast: Secondary | ICD-10-CM

## 2021-04-08 DIAGNOSIS — Z0000 Encounter for general adult medical examination without abnormal findings: Secondary | ICD-10-CM

## 2021-04-08 DIAGNOSIS — R7989 Other specified abnormal findings of blood chemistry: Secondary | ICD-10-CM

## 2021-04-08 DIAGNOSIS — N951 Menopausal and female climacteric states: Secondary | ICD-10-CM

## 2021-04-08 DIAGNOSIS — M0579 Rheumatoid arthritis with rheumatoid factor of multiple sites without organ or systems involvement: Secondary | ICD-10-CM

## 2021-04-08 DIAGNOSIS — Z23 Encounter for immunization: Secondary | ICD-10-CM

## 2021-04-08 DIAGNOSIS — Z1211 Encounter for screening for malignant neoplasm of colon: Secondary | ICD-10-CM

## 2021-04-08 LAB — CBC WITH DIFFERENTIAL/PLATELET
Basophils Absolute: 0.1 K/uL (ref 0.0–0.1)
Basophils Relative: 0.7 % (ref 0.0–3.0)
Eosinophils Absolute: 0.1 K/uL (ref 0.0–0.7)
Eosinophils Relative: 1.6 % (ref 0.0–5.0)
HCT: 34.2 % — ABNORMAL LOW (ref 36.0–46.0)
Hemoglobin: 11.1 g/dL — ABNORMAL LOW (ref 12.0–15.0)
Lymphocytes Relative: 31.7 % (ref 12.0–46.0)
Lymphs Abs: 2.8 K/uL (ref 0.7–4.0)
MCHC: 32.4 g/dL (ref 30.0–36.0)
MCV: 81.8 fl (ref 78.0–100.0)
Monocytes Absolute: 0.5 K/uL (ref 0.1–1.0)
Monocytes Relative: 5.4 % (ref 3.0–12.0)
Neutro Abs: 5.4 K/uL (ref 1.4–7.7)
Neutrophils Relative %: 60.6 % (ref 43.0–77.0)
Platelets: 312 K/uL (ref 150.0–400.0)
RBC: 4.19 Mil/uL (ref 3.87–5.11)
RDW: 17.2 % — ABNORMAL HIGH (ref 11.5–15.5)
WBC: 8.8 K/uL (ref 4.0–10.5)

## 2021-04-08 LAB — POCT URINALYSIS DIPSTICK
Bilirubin, UA: NEGATIVE
Glucose, UA: NEGATIVE
Ketones, UA: NEGATIVE
Leukocytes, UA: NEGATIVE
Nitrite, UA: NEGATIVE
Protein, UA: POSITIVE — AB
Spec Grav, UA: 1.025
Urobilinogen, UA: 0.2 U/dL
pH, UA: 6

## 2021-04-08 LAB — HEPATIC FUNCTION PANEL
ALT: 13 U/L (ref 0–35)
AST: 17 U/L (ref 0–37)
Albumin: 4 g/dL (ref 3.5–5.2)
Alkaline Phosphatase: 84 U/L (ref 39–117)
Bilirubin, Direct: 0.2 mg/dL (ref 0.0–0.3)
Total Bilirubin: 1 mg/dL (ref 0.2–1.2)
Total Protein: 7.2 g/dL (ref 6.0–8.3)

## 2021-04-08 LAB — HEMOGLOBIN A1C: Hgb A1c MFr Bld: 5.9 % (ref 4.6–6.5)

## 2021-04-08 MED ORDER — AMLODIPINE BESYLATE 5 MG PO TABS: 5.0000 mg | ORAL_TABLET | Freq: Every day | ORAL | 0 refills | Status: DC

## 2021-04-08 NOTE — Assessment & Plan Note (Signed)
Hemoglobin 10 last visit 4 weeks ago. She has a history of heavy menstrual periods with large clots. Will repeat CBC and check iron panel today.  ?

## 2021-04-08 NOTE — Assessment & Plan Note (Signed)
Chronic, stable. She has been following with rheumatology and her symptoms have improved significantly from last visit. She is taking methotrexate weekly, prednisone daily, and getting an infusion that she doesn't remember the name of. She has no swelling or pain in her joints today, and she has full ROM. Continue collaboration and recommendations from rheumatology.  ?

## 2021-04-08 NOTE — Assessment & Plan Note (Signed)
Alkaline phosphatase and AST slightly elevated last month. Will repeat LFTs today.  ?

## 2021-04-08 NOTE — Patient Instructions (Addendum)
It was great to see you! ? ?We are checking a few labs and will let you know the results.  ? ?Start amlodipine 5 mg (1 tablet) daily for your blood pressure. Keep checking your blood pressure daily and writing it down. Limit the amount of salt in your diet. If you blood pressure is still above 140/90 in 7 days, take 2 tablets daily.  ? ?I have placed a referral to GI to schedule your screening colonoscopy.  ? ?I have placed an order for screening mammogram, they should call you to schedule. If you do not hear from them in the next week, please call: ? ?[facility] ?[address], [address] ?[place], [place] ?[phone] ? ?Let's follow-up in 4 weeks, sooner if you have concerns. ? ?If a referral was placed today, you will be contacted for an appointment. Please note that routine referrals can sometimes take up to 3-4 weeks to process. Please call our office if you haven't heard anything after this time frame. ? ?Take care, ? ?Vance Peper, NP ? ?

## 2021-04-08 NOTE — Assessment & Plan Note (Signed)
BMI 45. Discussed diet and exercise. She currently takes the stairs at work instead of the elevator and tries to walk when able. Goal is to lose 1-2 pounds per week.  ?

## 2021-04-08 NOTE — Assessment & Plan Note (Signed)
New onset hypertension. Blood pressure was elevated last visit and during readings at home. Will start amlodipine 5 mg daily, if after 7 days she is not below 140/90, she can start '10mg'$  daily. Discussed limiting salt in her diet. CMP, CBC reviewed from last visit. Keep checking blood pressure daily at home and writing it down. Follow up in 4 weeks.  ?

## 2021-04-08 NOTE — Assessment & Plan Note (Signed)
She has missed 3 menstrual periods and before that she was alternating spotting with regular periods. She also endorses hot flashes. Discussed when menopause starts and to follow-up if hot flashes worsen or with any concerns.  ?

## 2021-04-09 LAB — IRON,TIBC AND FERRITIN PANEL
%SAT: 24 % (calc) (ref 16–45)
Ferritin: 10 ng/mL — ABNORMAL LOW (ref 16–232)
Iron: 106 ug/dL (ref 40–190)
TIBC: 445 mcg/dL (calc) (ref 250–450)

## 2021-04-13 ENCOUNTER — Encounter: Payer: Self-pay | Admitting: Gastroenterology

## 2021-04-13 LAB — CYTOLOGY - PAP
Comment: NEGATIVE
Diagnosis: UNDETERMINED — AB
High risk HPV: NEGATIVE

## 2021-04-19 LAB — BASIC METABOLIC PANEL
BUN: 13 (ref 4–21)
CO2: 26 — AB (ref 13–22)
Chloride: 102 (ref 99–108)
Creatinine: 0.9 (ref 0.5–1.1)
Glucose: 69
Potassium: 3.6 mEq/L (ref 3.5–5.1)
Sodium: 140 (ref 137–147)

## 2021-04-19 LAB — CBC AND DIFFERENTIAL
HCT: 36 (ref 36–46)
Hemoglobin: 11.4 — AB (ref 12.0–16.0)
Neutrophils Absolute: 6
Platelets: 348 10*3/uL (ref 150–400)
WBC: 9.4

## 2021-04-19 LAB — HEPATIC FUNCTION PANEL
ALT: 16 U/L (ref 7–35)
AST: 18 (ref 13–35)
Alkaline Phosphatase: 102 (ref 25–125)

## 2021-04-19 LAB — COMPREHENSIVE METABOLIC PANEL
Calcium: 8.9 (ref 8.7–10.7)
eGFR: 86

## 2021-04-21 ENCOUNTER — Encounter: Payer: Self-pay | Admitting: Nurse Practitioner

## 2021-05-14 ENCOUNTER — Ambulatory Visit (AMBULATORY_SURGERY_CENTER): Payer: 59 | Admitting: *Deleted

## 2021-05-14 VITALS — Ht 68.0 in | Wt 294.0 lb

## 2021-05-14 DIAGNOSIS — Z1211 Encounter for screening for malignant neoplasm of colon: Secondary | ICD-10-CM

## 2021-05-14 MED ORDER — NA SULFATE-K SULFATE-MG SULF 17.5-3.13-1.6 GM/177ML PO SOLN: 1.0000 | Freq: Once | ORAL | 0 refills | Status: AC

## 2021-05-14 NOTE — Progress Notes (Signed)
No egg or soy allergy known to patient  ?No issues known to pt with past sedation with any surgeries or procedures ?Patient denies ever being told they had issues or difficulty with intubation  ?No FH of Malignant Hyperthermia ?Pt is not on diet pills ?Pt is not on  home 02  ?Pt is not on blood thinners  ?Pt denies issues with constipation  ?No A fib or A flutter ? ?NO PA's for preps discussed with pt In PV today  ?Discussed with pt there will be an out-of-pocket cost for prep and that varies from $0 to 70 +  dollars - pt verbalized understanding  ?Pt instructed to use Singlecare.com or GoodRx for a price reduction on prep  ? ?PV completed over the phone. Pt verified name, DOB, address and insurance during PV today.  ?Pt mailed instruction packet with copy of consent form to read and not return, and instructions.  ?Pt encouraged to call with questions or issues.  ?If pt has My chart, procedure instructions sent via My Chart  ? ?

## 2021-05-25 ENCOUNTER — Ambulatory Visit
Admission: RE | Admit: 2021-05-25 | Discharge: 2021-05-25 | Disposition: A | Discharge status: Home / Self Care | Payer: 59 | Source: Ambulatory Visit | Attending: Nurse Practitioner | Admitting: Nurse Practitioner

## 2021-05-25 DIAGNOSIS — Z1231 Encounter for screening mammogram for malignant neoplasm of breast: Secondary | ICD-10-CM

## 2021-05-26 ENCOUNTER — Encounter: Payer: Self-pay | Admitting: Gastroenterology

## 2021-05-28 ENCOUNTER — Ambulatory Visit: Payer: 59 | Admitting: Nurse Practitioner

## 2021-05-28 NOTE — Progress Notes (Addendum)
? ?Established Patient Office Visit ? ?Subjective   ?Patient ID: [name], female    DOB: 1975-05-18  Age: 46 y.o. MRN: [id number] ? ?Chief Complaint  ?Patient presents with  ? Hypertension  ?  4 wk f/u HTN.   ? ? ?HPI ? ?Sevilla is here today to follow-up on hypertension. Last visit she was started on amlodipine $RemoveBefor'5mg'rzAGlRskcHkm$  daily. She has not been checking blood at home.  She denies side effects of the medication, chest pain, shortness of breath, headaches, and leg swelling.  She has been under more stress at home recently, she has been helping care for her mom after surgery last month.  She has been trying to limit the amount of salt in her diet. ? ?Past Medical History:  ?Diagnosis Date  ? Anemia   ? on Iron  ? Hypertension   ? on Amlodipine  ? Obesity   ? Rheumatoid arthritis ([place])   ? ?Past Surgical History:  ?Procedure Laterality Date  ? APPENDECTOMY    ? BREAST SURGERY    ? reduction  ? CESAREAN SECTION    ? X2  ? REDUCTION MAMMAPLASTY    ? TUBAL LIGATION    ? ? ?ROS ?See pertinent positives and negatives per HPI. ?  ?Objective:  ?  ? ?BP (!) 150/108 (BP Location: Right Wrist, Cuff Size: Normal)   Pulse 82   Temp (!) 96.1 ?F (35.6 ?C) (Temporal)   Wt 295 lb 9.6 oz (134.1 kg)   LMP  (LMP Unknown) Comment: last period 5 months ago  SpO2 100%   BMI 44.95 kg/m?  ?BP Readings from Last 3 Encounters:  ?05/31/21 (!) 150/108  ?04/08/21 (!) 148/100  ?03/11/21 (!) 164/110  ? ? ?Physical Exam ?Vitals and nursing note reviewed.  ?Constitutional:   ?   General: She is not in acute distress. ?   Appearance: Normal appearance.  ?HENT:  ?   Head: Normocephalic.  ?Eyes:  ?   Conjunctiva/sclera: Conjunctivae normal.  ?Cardiovascular:  ?   Rate and Rhythm: Normal rate and regular rhythm.  ?   Pulses: Normal pulses.  ?   Heart sounds: Normal heart sounds.  ?Pulmonary:  ?   Effort: Pulmonary effort is normal.  ?   Breath sounds: Normal breath sounds.  ?Musculoskeletal:  ?   Cervical back: Normal range of motion.  ?Skin: ?    General: Skin is warm.  ?Neurological:  ?   General: No focal deficit present.  ?   Mental Status: She is alert and oriented to person, place, and time.  ?Psychiatric:     ?   Mood and Affect: Mood normal.     ?   Behavior: Behavior normal.     ?   Thought Content: Thought content normal.     ?   Judgment: Judgment normal.  ? ? ? ?No results found for any visits on 05/31/21. ? ?Last metabolic panel ?Lab Results  ?Component Value Date  ? GLUCOSE 90 03/11/2021  ? NA 140 04/19/2021  ? K 3.6 04/19/2021  ? CL 102 04/19/2021  ? CO2 26 (A) 04/19/2021  ? BUN 13 04/19/2021  ? CREATININE 0.9 04/19/2021  ? EGFR 86 04/19/2021  ? CALCIUM 8.9 04/19/2021  ? PROT 7.2 04/08/2021  ? ALBUMIN 4.0 04/08/2021  ? BILITOT 1.0 04/08/2021  ? ALKPHOS 102 04/19/2021  ? AST 18 04/19/2021  ? ALT 16 04/19/2021  ? ?  ? ?The 10-year ASCVD risk score (Arnett DK, et al., 2019) is: 3.8% ? ?  ?  Assessment & Plan:  ? ?Problem List Items Addressed This Visit   ? ?  ? Cardiovascular and Mediastinum  ? Primary hypertension - Primary  ?  Chronic, not controlled.  Blood pressure is still 150/108.  She has been having more stress at home recently, caring for her mom and working.  She has been trying to limit the amount of salt in her diet.  We will increase her amlodipine to 10 mg daily.  Encouraged her to check her blood pressure at home.  Follow-up in 6 to 8 weeks. ? ?  ?  ? Relevant Medications  ? amLODipine (NORVASC) 10 MG tablet  ? ? ?Return in about 2 months (around 07/31/2021) for HTN.  ? ? ?Charyl Dancer, NP ? ?

## 2021-05-31 ENCOUNTER — Ambulatory Visit: Payer: 59 | Admitting: Nurse Practitioner

## 2021-05-31 ENCOUNTER — Encounter: Payer: Self-pay | Admitting: Nurse Practitioner

## 2021-05-31 VITALS — BP 150/108 | HR 82 | Temp 96.1°F | Wt 295.6 lb

## 2021-05-31 DIAGNOSIS — I10 Essential (primary) hypertension: Secondary | ICD-10-CM

## 2021-05-31 MED ORDER — AMLODIPINE BESYLATE 10 MG PO TABS: 10.0000 mg | ORAL_TABLET | Freq: Every day | ORAL | 1 refills | Status: DC

## 2021-05-31 NOTE — Assessment & Plan Note (Signed)
Chronic, not controlled.  Blood pressure is still 150/108.  She has been having more stress at home recently, caring for her mom and working.  She has been trying to limit the amount of salt in her diet.  We will increase her amlodipine to 10 mg daily.  Encouraged her to check her blood pressure at home.  Follow-up in 6 to 8 weeks. ?

## 2021-05-31 NOTE — Patient Instructions (Signed)
It was great to see you! ? ?Increase your amlodipine to 10 mg daily. You can take 2 of your current pills, then when you pick up the new bottle at your pharmacy, go back down to 1 pill.  ? ?Let's follow-up in 2 months, sooner if you have concerns. ? ?If a referral was placed today, you will be contacted for an appointment. Please note that routine referrals can sometimes take up to 3-4 weeks to process. Please call our office if you haven't heard anything after this time frame. ? ?Take care, ? ?Vance Peper, NP ? ?

## 2021-06-01 ENCOUNTER — Telehealth: Payer: Self-pay | Admitting: Gastroenterology

## 2021-06-01 NOTE — Telephone Encounter (Signed)
Noted. Thanks.

## 2021-06-01 NOTE — Telephone Encounter (Signed)
Good Afternoon Dr. Tarri Glenn, ? ? ?Patient called to reschedule her colonoscopy with you on 5/5 at 10:30 due to a scheduling conflict. ? ?Patient was rescheduled for 6/23 at 10:30 ?

## 2021-06-04 ENCOUNTER — Encounter: Payer: 59 | Admitting: Gastroenterology

## 2021-07-23 ENCOUNTER — Encounter: Payer: Self-pay | Admitting: Gastroenterology

## 2021-07-23 ENCOUNTER — Ambulatory Visit (AMBULATORY_SURGERY_CENTER): Payer: 59 | Admitting: Gastroenterology

## 2021-07-23 VITALS — BP 138/78 | HR 89 | Temp 97.1°F | Resp 16 | Ht 68.0 in | Wt 294.0 lb

## 2021-07-23 DIAGNOSIS — D128 Benign neoplasm of rectum: Secondary | ICD-10-CM

## 2021-07-23 DIAGNOSIS — Z1211 Encounter for screening for malignant neoplasm of colon: Secondary | ICD-10-CM | POA: Diagnosis present

## 2021-07-23 MED ORDER — SODIUM CHLORIDE 0.9 % IV SOLN: 500.0000 mL | Freq: Once | INTRAVENOUS | Status: DC

## 2021-07-26 ENCOUNTER — Telehealth: Payer: Self-pay

## 2021-07-27 ENCOUNTER — Encounter: Payer: Self-pay | Admitting: Gastroenterology

## 2021-08-02 ENCOUNTER — Ambulatory Visit: Payer: 59 | Admitting: Nurse Practitioner

## 2021-08-24 NOTE — Progress Notes (Unsigned)
   Established Patient Office Visit  Subjective   Patient ID: TYESHA JOFFE, female    DOB: September 05, 1975  Age: 46 y.o. MRN: [id number]  No chief complaint on file.   HPI  HOLLYANN PABLO is here to follow-up on hypertension, anemia, and prediabetes. Last visit her amlodipine was increased to '10mg'$  daily.   {History (Optional):23778}  ROS    Objective:     There were no vitals taken for this visit. {Vitals History (Optional):23777}  Physical Exam   No results found for any visits on 08/25/21.  {Labs (Optional):23779}  The 10-year ASCVD risk score (Arnett DK, et al., 2019) is: 2.6%    Assessment & Plan:   Problem List Items Addressed This Visit   None   No follow-ups on file.    Charyl Dancer, NP

## 2021-08-25 ENCOUNTER — Ambulatory Visit: Payer: 59 | Admitting: Nurse Practitioner

## 2021-08-25 ENCOUNTER — Encounter: Payer: Self-pay | Admitting: Nurse Practitioner

## 2021-08-25 VITALS — BP 130/90 | HR 75 | Temp 96.1°F | Wt 304.0 lb

## 2021-08-25 DIAGNOSIS — R7303 Prediabetes: Secondary | ICD-10-CM | POA: Insufficient documentation

## 2021-08-25 DIAGNOSIS — I10 Essential (primary) hypertension: Secondary | ICD-10-CM | POA: Diagnosis not present

## 2021-08-25 DIAGNOSIS — D649 Anemia, unspecified: Secondary | ICD-10-CM

## 2021-08-25 LAB — BASIC METABOLIC PANEL
BUN: 9 mg/dL (ref 6–23)
CO2: 25 mEq/L (ref 19–32)
Calcium: 9.7 mg/dL (ref 8.4–10.5)
Chloride: 103 mEq/L (ref 96–112)
Creatinine, Ser: 0.84 mg/dL (ref 0.40–1.20)
GFR: 83.91 mL/min (ref >60.00)
Glucose, Bld: 70 mg/dL (ref 70–99)
Potassium: 3.5 mEq/L (ref 3.5–5.1)
Sodium: 138 mEq/L (ref 135–145)

## 2021-08-25 LAB — CBC
HCT: 43.1 % (ref 36.0–46.0)
Hemoglobin: 14 g/dL (ref 12.0–15.0)
MCHC: 32.4 g/dL (ref 30.0–36.0)
MCV: 85.8 fl (ref 78.0–100.0)
Platelets: 309 10*3/uL (ref 150.0–400.0)
RBC: 5.03 Mil/uL (ref 3.87–5.11)
RDW: 15.3 % (ref 11.5–15.5)
WBC: 7.8 10*3/uL (ref 4.0–10.5)

## 2021-08-25 LAB — FERRITIN: Ferritin: 21.6 ng/mL (ref 10.0–291.0)

## 2021-08-25 LAB — HEMOGLOBIN A1C: Hgb A1c MFr Bld: 5.9 % (ref 4.6–6.5)

## 2021-08-25 MED ORDER — AMLODIPINE BESYLATE 10 MG PO TABS
10.0000 mg | ORAL_TABLET | Freq: Every day | ORAL | 1 refills | Status: DC
Start: 2021-08-25 — End: 2022-08-16

## 2021-08-25 NOTE — Patient Instructions (Signed)
It was great to see you!  Keep taking your amlodipine once a day.  We are checking your labs today and will let you know the results via mychart/phone.    Let's follow-up in 6 months, sooner if you have concerns.  If a referral was placed today, you will be contacted for an appointment. Please note that routine referrals can sometimes take up to 3-4 weeks to process. Please call our office if you haven't heard anything after this time frame.  Take care,  Vance Peper, NP

## 2021-08-25 NOTE — Assessment & Plan Note (Signed)
She is currently taking an iron supplement half a tablet every other day.  We will check CBC and ferritin today.

## 2021-08-25 NOTE — Assessment & Plan Note (Signed)
Chronic, stable.  Blood pressure today 130/90.  Continue amlodipine 10 mg daily.  Refill sent to the pharmacy.  Check BMP, CBC.  Follow-up in 6 months.

## 2021-08-25 NOTE — Assessment & Plan Note (Signed)
Last A1c was elevated at 5.9%.  We will check A1c today.

## 2022-02-25 ENCOUNTER — Telehealth: Payer: Self-pay | Admitting: Nurse Practitioner

## 2022-02-25 ENCOUNTER — Ambulatory Visit: Payer: 59 | Admitting: Nurse Practitioner

## 2022-02-25 NOTE — Telephone Encounter (Signed)
1st no show, fee waived, letter sent 

## 2022-02-25 NOTE — Telephone Encounter (Signed)
Noted  

## 2022-02-25 NOTE — Telephone Encounter (Signed)
1.26.24 no show letter sent

## 2022-03-22 LAB — CBC AND DIFFERENTIAL
HCT: 37 (ref 36–46)
Hemoglobin: 12.1 (ref 12.0–16.0)
Neutrophils Absolute: 4.2
Platelets: 321 10*3/uL (ref 150–400)
WBC: 7.5

## 2022-03-22 LAB — COMPREHENSIVE METABOLIC PANEL
Albumin: 3.9 (ref 3.5–5.0)
Calcium: 9.1 (ref 8.7–10.7)
eGFR: 97

## 2022-03-22 LAB — HEPATIC FUNCTION PANEL
ALT: 34 U/L (ref 7–35)
AST: 32 (ref 13–35)
Alkaline Phosphatase: 148 — AB (ref 25–125)
Bilirubin, Total: 0.5

## 2022-03-22 LAB — BASIC METABOLIC PANEL
BUN: 10 (ref 4–21)
CO2: 23 — AB (ref 13–22)
Chloride: 103 (ref 99–108)
Creatinine: 0.8 (ref 0.5–1.1)
Glucose: 82
Potassium: 3.9 mEq/L (ref 3.5–5.1)
Sodium: 140 (ref 137–147)

## 2022-03-22 LAB — CBC: RBC: 4.32 (ref 3.87–5.11)

## 2022-03-22 LAB — POCT ERYTHROCYTE SEDIMENTATION RATE, NON-AUTOMATED: Sed Rate: 35

## 2022-07-08 ENCOUNTER — Encounter: Payer: Self-pay | Admitting: Nurse Practitioner

## 2022-07-12 LAB — HEPATITIS B SURFACE ANTIGEN: Hepatitis B Surface Ag: NONREACTIVE

## 2022-07-12 LAB — HEPATIC FUNCTION PANEL
ALT: 16 U/L (ref 7–35)
AST: 32 (ref 13–35)
Alkaline Phosphatase: 142 — AB (ref 25–125)
Bilirubin, Total: 0.5

## 2022-07-12 LAB — BASIC METABOLIC PANEL
BUN: 11 (ref 4–21)
CO2: 21 (ref 13–22)
Chloride: 103 (ref 99–108)
Creatinine: 0.9 (ref 0.5–1.1)
Glucose: 77
Potassium: 3.9 mEq/L (ref 3.5–5.1)
Sodium: 139 (ref 137–147)

## 2022-07-12 LAB — COMPREHENSIVE METABOLIC PANEL
Albumin: 4.1 (ref 3.5–5.0)
Calcium: 9.2 (ref 8.7–10.7)
Globulin: 3.2
eGFR: 78

## 2022-07-12 LAB — CBC AND DIFFERENTIAL
HCT: 32 — AB (ref 36–46)
Hemoglobin: 10.5 — AB (ref 12.0–16.0)
Neutrophils Absolute: 4.4
WBC: 7.3

## 2022-07-12 LAB — POCT ERYTHROCYTE SEDIMENTATION RATE, NON-AUTOMATED: Sed Rate: 39

## 2022-07-12 LAB — CBC: RBC: 3.95 (ref 3.87–5.11)

## 2022-07-13 ENCOUNTER — Encounter: Payer: Self-pay | Admitting: Nurse Practitioner

## 2022-08-16 ENCOUNTER — Ambulatory Visit (INDEPENDENT_AMBULATORY_CARE_PROVIDER_SITE_OTHER): Payer: 59 | Admitting: Nurse Practitioner

## 2022-08-16 ENCOUNTER — Encounter: Payer: Self-pay | Admitting: Nurse Practitioner

## 2022-08-16 VITALS — BP 134/88 | HR 71 | Temp 97.9°F | Ht 68.0 in | Wt 322.2 lb

## 2022-08-16 DIAGNOSIS — D649 Anemia, unspecified: Secondary | ICD-10-CM

## 2022-08-16 DIAGNOSIS — R7303 Prediabetes: Secondary | ICD-10-CM | POA: Diagnosis not present

## 2022-08-16 DIAGNOSIS — I10 Essential (primary) hypertension: Secondary | ICD-10-CM

## 2022-08-16 DIAGNOSIS — Z1231 Encounter for screening mammogram for malignant neoplasm of breast: Secondary | ICD-10-CM

## 2022-08-16 DIAGNOSIS — Z1211 Encounter for screening for malignant neoplasm of colon: Secondary | ICD-10-CM

## 2022-08-16 DIAGNOSIS — Z0000 Encounter for general adult medical examination without abnormal findings: Secondary | ICD-10-CM | POA: Diagnosis not present

## 2022-08-16 DIAGNOSIS — M0579 Rheumatoid arthritis with rheumatoid factor of multiple sites without organ or systems involvement: Secondary | ICD-10-CM | POA: Diagnosis not present

## 2022-08-16 LAB — CBC WITH DIFFERENTIAL/PLATELET
Basophils Absolute: 0.1 10*3/uL (ref 0.0–0.1)
Basophils Relative: 0.9 % (ref 0.0–3.0)
Eosinophils Absolute: 0.2 10*3/uL (ref 0.0–0.7)
Eosinophils Relative: 2.9 % (ref 0.0–5.0)
HCT: 35.1 % — ABNORMAL LOW (ref 36.0–46.0)
Hemoglobin: 11.2 g/dL — ABNORMAL LOW (ref 12.0–15.0)
Lymphocytes Relative: 33.1 % (ref 12.0–46.0)
Lymphs Abs: 2.5 10*3/uL (ref 0.7–4.0)
MCHC: 31.8 g/dL (ref 30.0–36.0)
MCV: 80.9 fl (ref 78.0–100.0)
Monocytes Absolute: 0.7 10*3/uL (ref 0.1–1.0)
Monocytes Relative: 9.5 % (ref 3.0–12.0)
Neutro Abs: 4.1 10*3/uL (ref 1.4–7.7)
Neutrophils Relative %: 53.6 % (ref 43.0–77.0)
Platelets: 364 10*3/uL (ref 150.0–400.0)
RBC: 4.33 Mil/uL (ref 3.87–5.11)
RDW: 15.7 % — ABNORMAL HIGH (ref 11.5–15.5)
WBC: 7.7 10*3/uL (ref 4.0–10.5)

## 2022-08-16 LAB — LIPID PANEL
Cholesterol: 184 mg/dL (ref 0–200)
HDL: 56.9 mg/dL (ref >39.00)
LDL Cholesterol: 111 mg/dL — ABNORMAL HIGH (ref 0–99)
NonHDL: 127.38
Total CHOL/HDL Ratio: 3
Triglycerides: 84 mg/dL (ref 0.0–149.0)
VLDL: 16.8 mg/dL (ref 0.0–40.0)

## 2022-08-16 LAB — COMPREHENSIVE METABOLIC PANEL
ALT: 22 U/L (ref 0–35)
AST: 29 U/L (ref 0–37)
Albumin: 4.2 g/dL (ref 3.5–5.2)
Alkaline Phosphatase: 143 U/L — ABNORMAL HIGH (ref 39–117)
BUN: 13 mg/dL (ref 6–23)
CO2: 27 mEq/L (ref 19–32)
Calcium: 9.4 mg/dL (ref 8.4–10.5)
Chloride: 103 mEq/L (ref 96–112)
Creatinine, Ser: 0.85 mg/dL (ref 0.40–1.20)
GFR: 82.16 mL/min (ref >60.00)
Glucose, Bld: 89 mg/dL (ref 70–99)
Potassium: 4 mEq/L (ref 3.5–5.1)
Sodium: 136 mEq/L (ref 135–145)
Total Bilirubin: 0.8 mg/dL (ref 0.2–1.2)
Total Protein: 7.6 g/dL (ref 6.0–8.3)

## 2022-08-16 LAB — HEMOGLOBIN A1C: Hgb A1c MFr Bld: 5.9 % (ref 4.6–6.5)

## 2022-08-16 MED ORDER — TIRZEPATIDE 2.5 MG/0.5ML ~~LOC~~ SOAJ
2.5000 mg | SUBCUTANEOUS | 1 refills | Status: DC
Start: 2022-08-16 — End: 2022-08-22

## 2022-08-16 MED ORDER — AMLODIPINE BESYLATE 10 MG PO TABS: 10.0000 mg | ORAL_TABLET | Freq: Every day | ORAL | 1 refills | Status: AC

## 2022-08-16 NOTE — Assessment & Plan Note (Signed)
BMI 48.9.  She has been exercising routinely and has started limiting portion sizes and tracking her calories.  She has not lost any weight since doing this.  She is interested in medication to assist with weight loss.  She states that her insurance does not cover weight loss medications, however will cover Ozempic and Mounjaro for prediabetes.  Will have her start Mounjaro 2.5 mg injection weekly.  Discussed that most insurances do not cover these medications for prediabetes.  Follow-up in 6 to 8 weeks.

## 2022-08-16 NOTE — Patient Instructions (Signed)
It was great to see you!  We are checking your labs today and will let you know the results via mychart/phone.   Start mounjaro once a week injection. Your insurance will require a prior authorization and we will fill this out and be in touch with the results.   We have down for a mammogram at our office on 09/19/22 at 8:40am.   Let's follow-up in 3 months, sooner if you have concerns.  If a referral was placed today, you will be contacted for an appointment. Please note that routine referrals can sometimes take up to 3-4 weeks to process. Please call our office if you haven't heard anything after this time frame.  Take care,  Rodman Pickle, NP

## 2022-08-16 NOTE — Assessment & Plan Note (Signed)
She has stopped taking an iron supplement.  Will check CBC and iron panel today.

## 2022-08-16 NOTE — Assessment & Plan Note (Signed)
Chronic, stable.  She is continuing to follow with rheumatology and is taking methotrexate 15 mg weekly.  She states that she is also getting injections.  Continue collaboration recommendations from specialist.

## 2022-08-16 NOTE — Assessment & Plan Note (Signed)
Chronic, stable.  Continue amlodipine 10 mg daily.  Check CMP, CBC, lipid panel today.

## 2022-08-16 NOTE — Assessment & Plan Note (Signed)
Health maintenance reviewed and updated. Discussed nutrition, exercise. Check CMP, CBC today. Follow-up 1 year.   

## 2022-08-16 NOTE — Progress Notes (Signed)
BP 134/88 (BP Location: Left Wrist)   Pulse 71   Temp 97.9 F (36.6 C) (Oral)   Ht 5\' 8"  (1.727 m)   Wt (!) 322 lb 3.2 oz (146.1 kg)   LMP 08/01/2022 (Exact Date)   SpO2 98%   BMI 48.99 kg/m    Subjective:    Patient ID: [name], female    DOB: [birth year], 47 y.o.   MRN: [id number]  CC: Chief Complaint  Patient presents with   Annual Exam    With fasting lab work, options for weight loss    HPI: [name] is a 47 y.o. female presenting on 08/16/2022 for comprehensive medical examination. Current medical complaints include: Obesity  She states that she has been trying to lose weight, she has been limiting her portion sizes and increasing exercise.  She states that she has been exercising regularly with her daughter which helps her keep her accountable.  She has not noticed any weight loss since doing this.  She is interested in medication to help with weight loss.  She states that her insurance does not cover weight loss medications, however they do cover Mounjaro or Ozempic for prediabetes.  She is interested in starting [facility].  She currently lives with: husband Menopausal Symptoms: no  Depression and Anxiety Screen done today and results listed below:     08/16/2022    8:42 AM 04/08/2021   10:00 AM  Depression screen PHQ 2/9  Decreased Interest 0 0  Down, Depressed, Hopeless 0 0  PHQ - 2 Score 0 0  Altered sleeping 0 0  Tired, decreased energy 0 0  Change in appetite 1 0  Feeling bad or failure about yourself  0 0  Trouble concentrating 0 0  Moving slowly or fidgety/restless 0 0  Suicidal thoughts 0 0  PHQ-9 Score 1 0  Difficult doing work/chores Not difficult at all Not difficult at all      08/16/2022    8:43 AM 04/08/2021   10:00 AM  GAD 7 : Generalized Anxiety Score  Nervous, Anxious, on Edge 0 0  Control/stop worrying 0 0  Worry too much - different things 0 0  Trouble relaxing 0 0  Restless 0 0  Easily annoyed or irritable 0 0  Afraid  - awful might happen 0 0  Total GAD 7 Score 0 0  Anxiety Difficulty Not difficult at all Not difficult at all    The patient does not have a history of falls. I did not complete a risk assessment for falls. A plan of care for falls was not documented.   Past Medical History:  Past Medical History:  Diagnosis Date   Anemia    on Iron   Hypertension    on Amlodipine   Obesity    Rheumatoid arthritis (HCC)     Surgical History:  Past Surgical History:  Procedure Laterality Date   APPENDECTOMY     BREAST SURGERY     reduction   CESAREAN SECTION     X2   REDUCTION MAMMAPLASTY     TUBAL LIGATION      Medications:  Current Outpatient Medications on File Prior to Visit  Medication Sig   methotrexate (RHEUMATREX) 2.5 MG tablet Take 15 mg by mouth once a week.   No current facility-administered medications on file prior to visit.    Allergies:  No Known Allergies  Social History:  Social History   Socioeconomic History   Marital status: Married  Spouse name: Not on file   Number of children: Not on file   Years of education: Not on file   Highest education level: Not on file  Occupational History   Not on file  Tobacco Use   Smoking status: Never    Passive exposure: Yes   Smokeless tobacco: Never  Vaping Use   Vaping status: Never Used  Substance and Sexual Activity   Alcohol use: Yes    Comment: occ   Drug use: No   Sexual activity: Yes    Birth control/protection: Surgical    Comment: TUBAL LIGATION  Other Topics Concern   Not on file  Social History Narrative   Not on file   Social Determinants of Health   Financial Resource Strain: Not on file  Food Insecurity: Not on file  Transportation Needs: Not on file  Physical Activity: Not on file  Stress: Not on file  Social Connections: Not on file  Intimate Partner Violence: Not on file   Social History   Tobacco Use  Smoking Status Never   Passive exposure: Yes  Smokeless Tobacco Never    Social History   Substance and Sexual Activity  Alcohol Use Yes   Comment: occ    Family History:  Family History  Problem Relation Age of Onset   Breast cancer Mother    Hypertension Mother    Diabetes Mother    Cancer Mother        bone marrow, breast   Other Mother        gastric polyps   Cancer Father        prostate   Cancer Paternal Uncle        prostate cancer   Cancer Paternal Grandfather        prostate cancer   Colon cancer Neg Hx    Colon polyps Neg Hx    Esophageal cancer Neg Hx    Rectal cancer Neg Hx    Stomach cancer Neg Hx     Past medical history, surgical history, medications, allergies, family history and social history reviewed with patient today and changes made to appropriate areas of the chart.   Review of Systems  Constitutional: Negative.   HENT: Negative.    Eyes: Negative.   Respiratory: Negative.    Cardiovascular: Negative.   Gastrointestinal: Negative.   Genitourinary: Negative.   Musculoskeletal: Negative.   Skin: Negative.   Neurological: Negative.   Psychiatric/Behavioral: Negative.     All other ROS negative except what is listed above and in the HPI.      Objective:    BP 134/88 (BP Location: Left Wrist)   Pulse 71   Temp 97.9 F (36.6 C) (Oral)   Ht 5\' 8"  (1.727 m)   Wt (!) 322 lb 3.2 oz (146.1 kg)   LMP 08/01/2022 (Exact Date)   SpO2 98%   BMI 48.99 kg/m   Wt Readings from Last 3 Encounters:  08/16/22 (!) 322 lb 3.2 oz (146.1 kg)  08/25/21 (!) 304 lb (137.9 kg)  07/23/21 294 lb (133.4 kg)    Physical Exam Vitals and nursing note reviewed.  Constitutional:      General: She is not in acute distress.    Appearance: Normal appearance. She is obese.  HENT:     Head: Normocephalic and atraumatic.     Right Ear: Tympanic membrane, ear canal and external ear normal.     Left Ear: Tympanic membrane, ear canal and external ear normal.  Eyes:  Conjunctiva/sclera: Conjunctivae normal.  Cardiovascular:      Rate and Rhythm: Normal rate and regular rhythm.     Pulses: Normal pulses.     Heart sounds: Normal heart sounds.  Pulmonary:     Effort: Pulmonary effort is normal.     Breath sounds: Normal breath sounds.  Abdominal:     Palpations: Abdomen is soft.     Tenderness: There is no abdominal tenderness.  Musculoskeletal:        General: Normal range of motion.     Cervical back: Normal range of motion and neck supple.     Right lower leg: No edema.     Left lower leg: No edema.  Lymphadenopathy:     Cervical: No cervical adenopathy.  Skin:    General: Skin is warm and dry.  Neurological:     General: No focal deficit present.     Mental Status: She is alert and oriented to person, place, and time.     Cranial Nerves: No cranial nerve deficit.     Coordination: Coordination normal.     Gait: Gait normal.  Psychiatric:        Mood and Affect: Mood normal.        Behavior: Behavior normal.        Thought Content: Thought content normal.        Judgment: Judgment normal.     Results for orders placed or performed in visit on 08/16/22  HM HIV SCREENING LAB  Result Value Ref Range   HM HIV Screening Negative - Patient reported   HM HEPATITIS C SCREENING LAB  Result Value Ref Range   HM Hepatitis Screen Negative - Patient Reported       Assessment & Plan:   Problem List Items Addressed This Visit       Cardiovascular and Mediastinum   Primary hypertension    Chronic, stable.  Continue amlodipine 10 mg daily.  Check CMP, CBC, lipid panel today.      Relevant Medications   amLODipine (NORVASC) 10 MG tablet   Other Relevant Orders   CBC with Differential/Platelet   Comprehensive metabolic panel   Lipid panel     Musculoskeletal and Integument   Rheumatoid arthritis involving multiple sites with positive rheumatoid factor (HCC)    Chronic, stable.  She is continuing to follow with rheumatology and is taking methotrexate 15 mg weekly.  She states that she is also getting  injections.  Continue collaboration recommendations from specialist.        Other   Morbid obesity (HCC)    BMI 48.9.  She has been exercising routinely and has started limiting portion sizes and tracking her calories.  She has not lost any weight since doing this.  She is interested in medication to assist with weight loss.  She states that her insurance does not cover weight loss medications, however will cover Ozempic and Mounjaro for prediabetes.  Will have her start Mounjaro 2.5 mg injection weekly.  Discussed that most insurances do not cover these medications for prediabetes.  Follow-up in 6 to 8 weeks.      Relevant Medications   tirzepatide [facility]) 2.5 MG/0.5ML Pen   Anemia    She has stopped taking an iron supplement.  Will check CBC and iron panel today.      Prediabetes    Last A1c was 5.9%.  Will repeat today.  We did discuss nutrition exercise.  She is also interested in starting medication to help with  weight loss and states that her insurance will cover [facility] for prediabetes and she would like to start this.  Will have her start Mounjaro 2.5 mg injection weekly.  Discussed possible side effects.  Follow-up in 6 to 8 weeks.      Relevant Medications   tirzepatide (MOUNJARO) 2.5 MG/0.5ML Pen   Other Relevant Orders   Hemoglobin A1c   Routine general medical examination at a health care facility - Primary    Health maintenance reviewed and updated. Discussed nutrition, exercise. Check CMP, CBC today. Follow-up 1 year.        Other Visit Diagnoses     Screen for colon cancer       Encounter for screening mammogram for malignant neoplasm of breast       Mammogram ordered today. She is scheduled on 09/19/22 at 8:40am for mobile mammogram bus   Relevant Orders   MM 3D SCREENING MAMMOGRAM BILATERAL BREAST        Follow up plan: Return in about 3 months (around 11/16/2022) for HTN, weight management .   LABORATORY TESTING:  - Pap smear: up to  date  IMMUNIZATIONS:   - Tdap: Tetanus vaccination status reviewed: last tetanus booster within 10 years. - Influenza: Postponed to flu season - Pneumovax: Not applicable - Prevnar: Not applicable - HPV: Not applicable - Zostavax vaccine: Not applicable  SCREENING: -Mammogram: Ordered today  - Colonoscopy: Up to date  - Bone Density: Not applicable   PATIENT COUNSELING:   Advised to take 1 mg of folate supplement per day if capable of pregnancy.   Sexuality: Discussed sexually transmitted diseases, partner selection, use of condoms, avoidance of unintended pregnancy  and contraceptive alternatives.   Advised to avoid cigarette smoking.  I discussed with the patient that most people either abstain from alcohol or drink within safe limits (<=14/week and <=4 drinks/occasion for males, <=7/weeks and <= 3 drinks/occasion for females) and that the risk for alcohol disorders and other health effects rises proportionally with the number of drinks per week and how often a drinker exceeds daily limits.  Discussed cessation/primary prevention of drug use and availability of treatment for abuse.   Diet: Encouraged to adjust caloric intake to maintain  or achieve ideal body weight, to reduce intake of dietary saturated fat and total fat, to limit sodium intake by avoiding high sodium foods and not adding table salt, and to maintain adequate dietary potassium and calcium preferably from fresh fruits, vegetables, and low-fat dairy products.    stressed the importance of regular exercise  Injury prevention: Discussed safety belts, safety helmets, smoke detector, smoking near bedding or upholstery.   Dental health: Discussed importance of regular tooth brushing, flossing, and dental visits.    NEXT PREVENTATIVE PHYSICAL DUE IN 1 YEAR. Return in about 3 months (around 11/16/2022) for HTN, weight management .

## 2022-08-16 NOTE — Assessment & Plan Note (Signed)
Last A1c was 5.9%.  Will repeat today.  We did discuss nutrition exercise.  She is also interested in starting medication to help with weight loss and states that her insurance will cover [facility] for prediabetes and she would like to start this.  Will have her start Mounjaro 2.5 mg injection weekly.  Discussed possible side effects.  Follow-up in 6 to 8 weeks.

## 2022-08-21 ENCOUNTER — Other Ambulatory Visit (HOSPITAL_COMMUNITY): Payer: Self-pay

## 2022-08-21 ENCOUNTER — Telehealth: Payer: Self-pay

## 2022-08-21 NOTE — Telephone Encounter (Signed)
Pharmacy Patient Advocate Encounter   Received notification from CoverMyMeds that prior authorization for [facility] 2.5MG /0.5ML pen-injectors is required/requested.   Insurance verification completed.   The patient is insured through [facility] .   Per test claim: PA submitted to [facility] via CoverMyMeds Key/confirmation #/EOC [id number] Status is pending

## 2022-08-22 ENCOUNTER — Encounter: Payer: Self-pay | Admitting: Nurse Practitioner

## 2022-08-22 MED ORDER — WEGOVY 0.25 MG/0.5ML ~~LOC~~ SOAJ: 0.2500 mg | SUBCUTANEOUS | 1 refills | Status: DC

## 2022-08-24 ENCOUNTER — Telehealth: Payer: Self-pay

## 2022-08-24 MED ORDER — CONTRAVE 8-90 MG PO TB12: ORAL_TABLET | ORAL | 0 refills | Status: DC

## 2022-08-24 NOTE — Addendum Note (Signed)
Addended by: Rodman Pickle A on: 08/24/2022 12:02 PM   Modules accepted: Orders

## 2022-08-24 NOTE — Telephone Encounter (Signed)
Pharmacy Patient Advocate Encounter  Received notification from [facility] that Prior Authorization for [facility] 0.25MG /0.5ML auto-injectors has been DENIED. Please advise how you'd like to proceed. Full denial letter will be uploaded to the media tab. See denial reason below.  PA #/Case ID/Reference #: [id number]  Denied due to plan exclusion

## 2022-08-24 NOTE — Telephone Encounter (Signed)
Pharmacy Patient Advocate Encounter   Received notification from CoverMyMeds that prior authorization for [facility] 0.25MG /0.5ML auto-injectors is required/requested.   Insurance verification completed.   The patient is insured through [facility] .   Per test claim: PA submitted to [facility] via CoverMyMeds Key/confirmation #/EOC [facility] Status is pending

## 2022-08-26 NOTE — Telephone Encounter (Signed)
Noted  

## 2022-08-26 NOTE — Telephone Encounter (Signed)
Patient notified of below message and will wait and does not want referral. She said that she is going to change her insurance soon.

## 2022-08-26 NOTE — Telephone Encounter (Signed)
LVM to return call.

## 2022-08-31 NOTE — Telephone Encounter (Signed)
Pharmacy Patient Advocate Encounter  Received notification from [facility] that Prior Authorization for Mounjaro 2.5MG /0.5ML pen-injectors has been DENIED. Please advise how you'd like to proceed. Full denial letter will be uploaded to the media tab. See denial reason below.  The request for coverage for Mounjaro Inj 2.5/0.5, use as directed (2 per month), is denied. This decision is based on health plan criteria for Mounjaro Inj 2.5/0.5. This medicine is covered only if: One of the following: (1) A1C greater than or equal to 6.5%. (B) Fasting plasma glucose greater than or equal to 126mg /dL. (C) 2-hour plasma glucose greater than or equal to 200mg /dL during oral glucose tolerance test. (D) Random plasma glucose greater than or equal to 200mg /dL with classic symptoms of hyperglycemia or hyperglycemic crisis. (2) If you require ongoing treatment for type 2 diabetes mellitus (that is, diagnosed greater tan 2 years ago), your doctor submits medical records (for example, chart noes) confirming diagnosis of type 2 diabetes mellitus.   PA #/Case ID/Reference #: [id number]

## 2022-09-19 ENCOUNTER — Inpatient Hospital Stay: Admission: RE | Admit: 2022-09-19 | Payer: 59 | Source: Ambulatory Visit

## 2022-10-17 ENCOUNTER — Inpatient Hospital Stay: Admission: RE | Admit: 2022-10-17 | Payer: 59 | Source: Ambulatory Visit

## 2022-10-17 ENCOUNTER — Telehealth: Payer: Self-pay

## 2022-10-17 NOTE — Telephone Encounter (Signed)
/  LVMTRC to reschedule MM. Pt can be added to the MM schedule today 10/17/22.

## 2022-11-01 LAB — BASIC METABOLIC PANEL
BUN: 12 (ref 4–21)
CO2: 21 (ref 13–22)
Chloride: 101 (ref 99–108)
Creatinine: 0.9 (ref 0.5–1.1)
Glucose: 86
Potassium: 4 meq/L (ref 3.5–5.1)
Sodium: 139 (ref 137–147)

## 2022-11-01 LAB — COMPREHENSIVE METABOLIC PANEL
Albumin: 3.9 (ref 3.5–5.0)
Calcium: 8.9 (ref 8.7–10.7)
Globulin: 3.4
eGFR: 76

## 2022-11-01 LAB — HEPATIC FUNCTION PANEL
ALT: 28 U/L (ref 7–35)
AST: 41 — AB (ref 13–35)
Alkaline Phosphatase: 181 — AB (ref 25–125)
Bilirubin, Total: 0.4

## 2022-11-01 LAB — CBC AND DIFFERENTIAL
HCT: 30 — AB (ref 36–46)
Hemoglobin: 9.8 — AB (ref 12.0–16.0)
Neutrophils Absolute: 5
Platelets: 316 10*3/uL (ref 150–400)
WBC: 8.3

## 2022-11-01 LAB — CBC: RBC: 3.62 — AB (ref 3.87–5.11)

## 2022-11-04 ENCOUNTER — Encounter: Payer: Self-pay | Admitting: Nurse Practitioner

## 2022-11-14 ENCOUNTER — Encounter: Payer: Self-pay | Admitting: Nurse Practitioner

## 2022-11-16 ENCOUNTER — Ambulatory Visit: Payer: 59 | Admitting: Nurse Practitioner

## 2022-12-08 ENCOUNTER — Encounter: Payer: Self-pay | Admitting: Nurse Practitioner

## 2022-12-08 ENCOUNTER — Ambulatory Visit: Payer: 59 | Admitting: Nurse Practitioner

## 2022-12-08 VITALS — BP 160/110 | HR 80 | Temp 97.6°F | Ht 68.0 in | Wt 324.2 lb

## 2022-12-08 DIAGNOSIS — I10 Essential (primary) hypertension: Secondary | ICD-10-CM

## 2022-12-08 DIAGNOSIS — M0579 Rheumatoid arthritis with rheumatoid factor of multiple sites without organ or systems involvement: Secondary | ICD-10-CM | POA: Diagnosis not present

## 2022-12-08 NOTE — Assessment & Plan Note (Addendum)
Elevated blood pressure was noted during the visit, attributed to not taking medication that day. She is advised to take her blood pressure medication before the next visit and to purchase a home blood pressure monitor. Blood pressure will be rechecked in 1-2 weeks; if controlled, we will consider starting phentermine for weight loss. If not controlled, an adjustment to the blood pressure medication will be considered. For now, continue amlodipine 10mg  daily.

## 2022-12-08 NOTE — Assessment & Plan Note (Signed)
She reports her arthritis is under control with Simponi infusions and a reduced dose of methotrexate. The current treatment regimen with Simponi infusions and methotrexate will continue. The next Simponi infusion is scheduled for 12/27/2022.

## 2022-12-08 NOTE — Patient Instructions (Signed)
It was great to see you!  Start checking your blood pressure at home.   You can look and see if you are interested in:  - Contrave : $99 per month - Zepbound - $400 per month for starting dose. $550 for next dose.   If we get you blood pressure under control, we can discuss phentermine.  We can always refer you to a dietician, [facility], bariatric surgeon   Let's follow-up in 2 weeks, sooner if you have concerns.  If a referral was placed today, you will be contacted for an appointment. Please note that routine referrals can sometimes take up to 3-4 weeks to process. Please call our office if you haven't heard anything after this time frame.  Take care,  Rodman Pickle, NP

## 2022-12-08 NOTE — Assessment & Plan Note (Signed)
Insurance does not cover [employer], leading to a discussion on alternatives such as Contrave, Zepbound, nutritionist consultation, [facility], bariatric surgery, and phentermine. She will consider these options and make a decision. Depending on her choice and blood pressure control, we may start Contrave, Zepbound, or phentermine.

## 2022-12-08 NOTE — Progress Notes (Signed)
Established Patient Office Visit  Subjective   Patient ID: [name], female    DOB: 1975-10-21  Age: 47 y.o. MRN: [id number]  Chief Complaint  Patient presents with   Weight Management    Discuss other options for weight loss    HPI  Discussed the use of AI scribe software for clinical note transcription with the patient, who gave verbal consent to proceed.  History of Present Illness   [name], a patient with a history of weight management issues, hypertension, and rheumatoid arthritis, presents for a follow-up visit. She reports that her insurance did not cover Wegovy, a weight loss medication she was considering. She has not been able to return to the gym due to her busy schedule but reports cooking at home daily. Her arthritis is currently under control with Simponi infusions every eight weeks and a reduced dose of methotrexate. She occasionally experiences knee pain but is able to dress independently and manage her daily activities. Her blood pressure was elevated during the visit, but she had not taken her blood pressure medication that day. She does not check her blood pressure at home. She denies chest pain, shortness of breath, and headaches.        ROS See pertinent positives and negatives per HPI.    Objective:     BP (!) 160/110 (BP Location: Left Wrist, Cuff Size: Normal)   Pulse 80   Temp 97.6 F (36.4 C)   Ht 5\' 8"  (1.727 m)   Wt (!) 324 lb 3.2 oz (147.1 kg)   SpO2 95%   BMI 49.29 kg/m  BP Readings from Last 3 Encounters:  12/08/22 (!) 160/110  08/16/22 134/88  08/25/21 130/90   Wt Readings from Last 3 Encounters:  12/08/22 (!) 324 lb 3.2 oz (147.1 kg)  08/16/22 (!) 322 lb 3.2 oz (146.1 kg)  08/25/21 (!) 304 lb (137.9 kg)      Physical Exam Vitals and nursing note reviewed.  Constitutional:      General: She is not in acute distress.    Appearance: Normal appearance. She is obese.  HENT:     Head: Normocephalic.  Eyes:      Conjunctiva/sclera: Conjunctivae normal.  Cardiovascular:     Rate and Rhythm: Normal rate and regular rhythm.     Pulses: Normal pulses.     Heart sounds: Normal heart sounds.  Pulmonary:     Effort: Pulmonary effort is normal.     Breath sounds: Normal breath sounds.  Musculoskeletal:     Cervical back: Normal range of motion.  Skin:    General: Skin is warm.  Neurological:     General: No focal deficit present.     Mental Status: She is alert and oriented to person, place, and time.  Psychiatric:        Mood and Affect: Mood normal.        Behavior: Behavior normal.        Thought Content: Thought content normal.        Judgment: Judgment normal.    The 10-year ASCVD risk score (Arnett DK, et al., 2019) is: 6.1%    Assessment & Plan:   Problem List Items Addressed This Visit       Cardiovascular and Mediastinum   Primary hypertension - Primary    Elevated blood pressure was noted during the visit, attributed to not taking medication that day. She is advised to take her blood pressure medication before the next visit and to purchase  a home blood pressure monitor. Blood pressure will be rechecked in 1-2 weeks; if controlled, we will consider starting phentermine for weight loss. If not controlled, an adjustment to the blood pressure medication will be considered. For now, continue amlodipine 10mg  daily.         Musculoskeletal and Integument   Rheumatoid arthritis involving multiple sites with positive rheumatoid factor (HCC)    She reports her arthritis is under control with Simponi infusions and a reduced dose of methotrexate. The current treatment regimen with Simponi infusions and methotrexate will continue. The next Simponi infusion is scheduled for 12/27/2022.       Relevant Medications   Golimumab (SIMPONI) 50 MG/0.5ML SOSY     Other   Morbid obesity (HCC)    Insurance does not cover Wegovy, leading to a discussion on alternatives such as Contrave, Zepbound,  nutritionist consultation, [facility], bariatric surgery, and phentermine. She will consider these options and make a decision. Depending on her choice and blood pressure control, we may start Contrave, Zepbound, or phentermine.       Return in about 2 weeks (around 12/22/2022) for HTN.    Gerre Scull, NP

## 2022-12-22 ENCOUNTER — Ambulatory Visit: Payer: 59 | Admitting: Nurse Practitioner

## 2023-01-04 ENCOUNTER — Ambulatory Visit: Payer: 59 | Admitting: Nurse Practitioner

## 2023-01-04 ENCOUNTER — Encounter: Payer: Self-pay | Admitting: Nurse Practitioner

## 2023-01-04 VITALS — BP 136/84 | HR 84 | Temp 97.4°F | Ht 68.0 in | Wt 327.0 lb

## 2023-01-04 DIAGNOSIS — I10 Essential (primary) hypertension: Secondary | ICD-10-CM

## 2023-01-04 MED ORDER — PHENTERMINE HCL 30 MG PO CAPS: 30.0000 mg | ORAL_CAPSULE | ORAL | 0 refills | Status: AC

## 2023-01-04 NOTE — Assessment & Plan Note (Signed)
Blood pressure was slightly elevated at the visit, yet she remains adherent to her medication regimen without any symptoms of hypertensive urgency or emergency. We will continue her current amlodipine 10mg  daily and recheck her blood pressure after starting phentermine.

## 2023-01-04 NOTE — Progress Notes (Signed)
Established Patient Office Visit  Subjective   Patient ID: [name], female    DOB: January 17, 1976  Age: 47 y.o. MRN: [id number]  Chief Complaint  Patient presents with   Hypertension    Follow up, no concerns   HPI:  Discussed the use of AI scribe software for clinical note transcription with the patient, who gave verbal consent to proceed.  History of Present Illness   The patient, with a history of hypertension, presents for a follow-up visit. She has not been checking her blood pressure at home but has been adherent to her antihypertensive medication. She denies experiencing any chest pain, shortness of breath, or headaches.  In addition to managing her hypertension, the patient is interested in weight loss medication options. She has done some research and has spoken to several people who have had positive experiences with phentermine. She is aware that her insurance does not cover weight loss medications and is prepared to pay out of pocket.        ROS See pertinent positives and negatives per HPI.    Objective:     BP 136/84 (BP Location: Left Wrist, Cuff Size: Normal)   Pulse 84   Temp (!) 97.4 F (36.3 C)   Ht 5\' 8"  (1.727 m)   Wt (!) 327 lb (148.3 kg)   SpO2 98%   BMI 49.72 kg/m  BP Readings from Last 3 Encounters:  01/04/23 136/84  12/08/22 (!) 160/110  08/16/22 134/88   Wt Readings from Last 3 Encounters:  01/04/23 (!) 327 lb (148.3 kg)  12/08/22 (!) 324 lb 3.2 oz (147.1 kg)  08/16/22 (!) 322 lb 3.2 oz (146.1 kg)      Physical Exam Vitals and nursing note reviewed.  Constitutional:      General: She is not in acute distress.    Appearance: Normal appearance.  HENT:     Head: Normocephalic.  Eyes:     Conjunctiva/sclera: Conjunctivae normal.  Cardiovascular:     Rate and Rhythm: Normal rate and regular rhythm.     Pulses: Normal pulses.     Heart sounds: Normal heart sounds.  Pulmonary:     Effort: Pulmonary effort is normal.      Breath sounds: Normal breath sounds.  Musculoskeletal:     Cervical back: Normal range of motion.  Skin:    General: Skin is warm.  Neurological:     General: No focal deficit present.     Mental Status: She is alert and oriented to person, place, and time.  Psychiatric:        Mood and Affect: Mood normal.        Behavior: Behavior normal.        Thought Content: Thought content normal.        Judgment: Judgment normal.    The 10-year ASCVD risk score (Arnett DK, et al., 2019) is: 2.9%    Assessment & Plan:   Problem List Items Addressed This Visit       Cardiovascular and Mediastinum   Primary hypertension - Primary    Blood pressure was slightly elevated at the visit, yet she remains adherent to her medication regimen without any symptoms of hypertensive urgency or emergency. We will continue her current amlodipine 10mg  daily and recheck her blood pressure after starting phentermine.        Other   Morbid obesity (HCC)    After discussing weight loss medication options, she expressed interest in starting phentermine. We will start phentermine,  30mg  capsule daily in the morning before eating. She is advised to monitor for side effects such as increased blood pressure, jitteriness, and dry mouth. Blood pressure will be checked a few days after initiating phentermine. A follow-up in 4 weeks is scheduled to assess response and side effects. PDMP reviewed. Continue focus on healthy eating habits and exercise.       Relevant Medications   phentermine 30 MG capsule    Return in about 4 weeks (around 02/01/2023) for weight management, htn.    Gerre Scull, NP

## 2023-01-04 NOTE — Assessment & Plan Note (Signed)
After discussing weight loss medication options, she expressed interest in starting phentermine. We will start phentermine, 30mg  capsule daily in the morning before eating. She is advised to monitor for side effects such as increased blood pressure, jitteriness, and dry mouth. Blood pressure will be checked a few days after initiating phentermine. A follow-up in 4 weeks is scheduled to assess response and side effects. PDMP reviewed. Continue focus on healthy eating habits and exercise.

## 2023-01-04 NOTE — Patient Instructions (Signed)
It was great to see you!  Start phentermine 1 capsule daily in the morning before you eat  Keep trying to limit portion sizes  Check your blood pressure if you are able after a couple of days of starting the medication.   Let's follow-up in 4 weeks, sooner if you have concerns.  If a referral was placed today, you will be contacted for an appointment. Please note that routine referrals can sometimes take up to 3-4 weeks to process. Please call our office if you haven't heard anything after this time frame.  Take care,  Rodman Pickle, NP

## 2023-01-14 ENCOUNTER — Emergency Department (HOSPITAL_BASED_OUTPATIENT_CLINIC_OR_DEPARTMENT_OTHER)
Admission: EM | Admit: 2023-01-14 | Discharge: 2023-01-14 | Disposition: A | Discharge status: Home / Self Care | Payer: 59 | Attending: Emergency Medicine | Admitting: Emergency Medicine

## 2023-01-14 ENCOUNTER — Encounter (HOSPITAL_BASED_OUTPATIENT_CLINIC_OR_DEPARTMENT_OTHER): Payer: Self-pay

## 2023-01-14 ENCOUNTER — Other Ambulatory Visit: Payer: Self-pay

## 2023-01-14 DIAGNOSIS — M545 Low back pain, unspecified: Secondary | ICD-10-CM | POA: Diagnosis not present

## 2023-01-14 DIAGNOSIS — S161XXA Strain of muscle, fascia and tendon at neck level, initial encounter: Secondary | ICD-10-CM | POA: Insufficient documentation

## 2023-01-14 DIAGNOSIS — Y92410 Unspecified street and highway as the place of occurrence of the external cause: Secondary | ICD-10-CM | POA: Insufficient documentation

## 2023-01-14 DIAGNOSIS — S199XXA Unspecified injury of neck, initial encounter: Secondary | ICD-10-CM | POA: Diagnosis present

## 2023-01-14 MED ORDER — METHOCARBAMOL 500 MG PO TABS
500.0000 mg | ORAL_TABLET | Freq: Two times a day (BID) | ORAL | 0 refills | Status: AC
Start: 2023-01-14 — End: ?
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
## 2023-01-16 ENCOUNTER — Telehealth: Payer: Self-pay

## 2023-01-16 NOTE — Transitions of Care (Post Inpatient/ED Visit) (Signed)
   01/16/2023  Name: [name] MRN: [id number] DOB: [birth year]  Today's TOC FU Call Status: Today's TOC FU Call Status:: Unsuccessful Call (1st Attempt) Unsuccessful Call (1st Attempt) Date: 01/16/23 Patient's Name and Date of Birth confirmed.  Transition Care Management Follow-up Telephone Call Date of Discharge: 01/16/23 Discharge Facility: [facility] Type of Discharge: Emergency Department Reason for ED Visit: Other: How have you been since you were released from the hospital?: Same Any questions or concerns?: No  Items Reviewed: Did you receive and understand the discharge instructions provided?: Yes Any new allergies since your discharge?: No Dietary orders reviewed?: NA Do you have support at home?: Yes  Medications Reviewed Today: Medications Reviewed Today   Medications were not reviewed in this encounter     Home Care and Equipment/Supplies: Were Home Health Services Ordered?: NA Any new equipment or medical supplies ordered?: NA  Functional Questionnaire: Do you need assistance with bathing/showering or dressing?: No Do you need assistance with meal preparation?: No Do you need assistance with eating?: No Do you have difficulty maintaining continence: No Do you need assistance with getting out of bed/getting out of a chair/moving?: No Do you have difficulty managing or taking your medications?: No  Follow up appointments reviewed: PCP Follow-up appointment confirmed?: Yes Date of PCP follow-up appointment?: 01/16/23 Follow-up Provider: Rodman Pickle Specialist [facility] Follow-up appointment confirmed?: NA Do you need transportation to your follow-up appointment?: No Do you understand care options if your condition(s) worsen?: Yes-patient verbalized understanding    Rosine Abe

## 2023-01-16 NOTE — Transitions of Care (Post Inpatient/ED Visit) (Signed)
   01/16/2023  Name: [name] MRN: [id number] DOB: November 08, 1975  Today's TOC FU Call Status: Today's TOC FU Call Status:: Unsuccessful Call (1st Attempt) Unsuccessful Call (1st Attempt) Date: 01/16/23  Attempted to reach the patient regarding the most recent Inpatient/ED visit.  Follow Up Plan: Additional outreach attempts will be made to reach the patient to complete the Transitions of Care (Post Inpatient/ED visit) call.   Derenda Fennel, RMA

## 2023-01-19 ENCOUNTER — Inpatient Hospital Stay: Payer: 59 | Admitting: Nurse Practitioner

## 2023-02-08 ENCOUNTER — Ambulatory Visit: Payer: 59 | Admitting: Nurse Practitioner

## 2023-02-22 ENCOUNTER — Encounter: Payer: Self-pay | Admitting: Nurse Practitioner

## 2023-05-07 ENCOUNTER — Emergency Department (HOSPITAL_BASED_OUTPATIENT_CLINIC_OR_DEPARTMENT_OTHER)
Admission: EM | Admit: 2023-05-07 | Discharge: 2023-05-07 | Disposition: A | Discharge status: Home / Self Care | Attending: Emergency Medicine | Admitting: Emergency Medicine

## 2023-05-07 ENCOUNTER — Other Ambulatory Visit: Payer: Self-pay

## 2023-05-07 ENCOUNTER — Encounter (HOSPITAL_BASED_OUTPATIENT_CLINIC_OR_DEPARTMENT_OTHER): Payer: Self-pay | Admitting: Emergency Medicine

## 2023-05-07 DIAGNOSIS — Z79899 Other long term (current) drug therapy: Secondary | ICD-10-CM | POA: Diagnosis not present

## 2023-05-07 DIAGNOSIS — R030 Elevated blood-pressure reading, without diagnosis of hypertension: Secondary | ICD-10-CM

## 2023-05-07 DIAGNOSIS — J029 Acute pharyngitis, unspecified: Secondary | ICD-10-CM | POA: Insufficient documentation

## 2023-05-07 DIAGNOSIS — I10 Essential (primary) hypertension: Secondary | ICD-10-CM | POA: Diagnosis not present

## 2023-05-07 LAB — RESP PANEL BY RT-PCR (RSV, FLU A&B, COVID)  RVPGX2
Influenza A by PCR: NEGATIVE
Influenza B by PCR: NEGATIVE
Resp Syncytial Virus by PCR: NEGATIVE
SARS Coronavirus 2 by RT PCR: NEGATIVE

## 2023-05-07 LAB — GROUP A STREP BY PCR: Group A Strep by PCR: NOT DETECTED

## 2023-05-07 NOTE — ED Triage Notes (Signed)
 Pt reports drinking a fireball shot for the first time on Friday and has had a burning in her throat ever since, denies any cough, ShoB. fever, or other s/s

## 2023-05-07 NOTE — ED Provider Notes (Signed)
 [place] [facility] Provider Note   CSN: [id number] Arrival date & time: 05/07/23  1912     History  Chief Complaint  Patient presents with   Sore Throat    [name] is a 48 y.o. female with history as listed below presents with complaints of sore throat.  Patient states she took a sip fireball 3 days ago.  And since then her throat is continued to burn.  Denies any cough, congestion, body aches or fevers.  States it is painful to eat.  No difficulty breathing reported.   Sore Throat   Past Medical History:  Diagnosis Date   Anemia    on Iron   Hypertension    on Amlodipine   Obesity    Rheumatoid arthritis (HCC)        Home Medications Prior to Admission medications   Medication Sig Start Date End Date Taking? Authorizing Provider  amLODipine (NORVASC) 10 MG tablet Take 1 tablet (10 mg total) by mouth daily. 08/16/22   McElwee, Lauren A, NP  Golimumab (SIMPONI) 50 MG/0.5ML SOSY Inject into the skin. Infusions every 8 weeks Patient not taking: Reported on 01/04/2023    [provider]  methocarbamol (ROBAXIN) 500 MG tablet Take 1 tablet (500 mg total) by mouth 2 (two) times daily. 01/14/23   Halford Decamp, PA-C  methotrexate (RHEUMATREX) 2.5 MG tablet Take 15 mg by mouth once a week. 12/26/20   [provider]  phentermine 30 MG capsule Take 1 capsule (30 mg total) by mouth every morning. 01/04/23   McElwee, Jake Church, NP      Allergies    Patient has no known allergies.    Review of Systems   Review of Systems  HENT:  Positive for sore throat.     Physical Exam Updated Vital Signs BP (!) 162/117 (BP Location: Left Wrist)   Pulse 84   Temp 98.2 F (36.8 C) (Oral)   Resp 20   Ht 5\' 8"  (1.727 m)   Wt (!) 145.2 kg   LMP 08/01/2022 (Exact Date)   SpO2 100%   BMI 48.66 kg/m  Physical Exam Vitals and nursing note reviewed.  Constitutional:      General: She is not in acute distress.     Appearance: She is well-developed.  HENT:     Head: Normocephalic and atraumatic.     Mouth/Throat:     Pharynx: Oropharyngeal exudate present.  Eyes:     Conjunctiva/sclera: Conjunctivae normal.  Cardiovascular:     Rate and Rhythm: Normal rate and regular rhythm.     Heart sounds: No murmur heard. Pulmonary:     Effort: Pulmonary effort is normal. No respiratory distress.     Breath sounds: Normal breath sounds.  Musculoskeletal:     Cervical back: Neck supple.  Skin:    General: Skin is warm and dry.     Capillary Refill: Capillary refill takes less than 2 seconds.  Neurological:     Mental Status: She is alert.  Psychiatric:        Mood and Affect: Mood normal.     ED Results / Procedures / Treatments   Labs (all labs ordered are listed, but only abnormal results are displayed) Labs Reviewed  GROUP A STREP BY PCR  RESP PANEL BY RT-PCR (RSV, FLU A&B, COVID)  [id number]    EKG None  Radiology No results found.  Procedures Procedures    Medications Ordered in ED Medications -  No data to display  ED Course/ Medical Decision Making/ A&P                                 Medical Decision Making  This patient presents to the ED with chief complaint(s) of sore throat.  The complaint involves an extensive differential diagnosis and also carries with it a high risk of complications and morbidity.   pertinent past medical history as listed in HPI  The differential diagnosis includes  Strep throat, peritonsillar abscess, Ludwig's angina, retropharyngeal abscess The initial plan is to  Respiratory panel and strep test ordered in triage Additional history obtained: Records reviewed Care Everywhere/External Records  Initial Assessment:   Patient presents hypertensive 162/117 with complaints of sore throat started after taking a shot of fireball 3 days ago.  Describes pain with swallowing, no reports of difficulty breathing.  Lung sounds are clear without stridor.  Airway  is patent without angioedema.  Uvula is midline.  Tolerates full range of motion of her neck.  Do not suspect retropharyngeal abscess although there is some exudate appreciated, no cervical lymphadenopathy.  She has no other URI symptoms.  Will evaluate with strep test and reassess.  Independent ECG interpretation:  none  Independent labs interpretation:  The following labs were independently interpreted:  Respiratory panel and strep test negative  Independent visualization and interpretation of imaging: none  Treatment and Reassessment: No medications administered during visit Noted to have elevated blood pressure during visit.  Asymptomatic.  Will follow with primary care doctor. Consultations obtained:   none  Disposition:   Patient will be discharged home. The patient has been appropriately medically screened and/or stabilized in the ED. I have low suspicion for any other emergent medical condition which would require further screening, evaluation or treatment in the ED or require inpatient management. At time of discharge the patient is hemodynamically stable and in no acute distress. I have discussed work-up results and diagnosis with patient and answered all questions. Patient is agreeable with discharge plan. We discussed strict return precautions for returning to the emergency department and they verbalized understanding.     Social Determinants of Health:   none  This note was dictated with voice recognition software.  Despite best efforts at proofreading, errors may have occurred which can change the documentation meaning.          Final Clinical Impression(s) / ED Diagnoses Final diagnoses:  Sore throat  Elevated blood pressure reading    Rx / DC Orders ED Discharge Orders     None         Fabienne Bruns 05/07/23 2025    Benjiman Core, MD 05/07/23 2223

## 2023-05-07 NOTE — Discharge Instructions (Addendum)
 You were evaluated in the emergency room for sore throat.  Your respiratory test and strep test were negative.  I anticipate your symptoms will resolve with time.  If they persist please follow-up with your primary care doctor.  You were additionally noted to have an elevated blood pressure during your visit.  Please follow-up with your primary care doctor for recheck.

## 2023-05-11 ENCOUNTER — Telehealth: Payer: Self-pay

## 2023-05-11 NOTE — Transitions of Care (Post Inpatient/ED Visit) (Signed)
   05/11/2023  Name: [name] MRN: [id number] DOB: [birth year]  Today's TOC FU Call Status: Today's TOC FU Call Status:: Unsuccessful Call (1st Attempt) Unsuccessful Call (1st Attempt) Date: 05/11/23  Attempted to reach the patient regarding the most recent Inpatient/ED visit.  Follow Up Plan: Additional outreach attempts will be made to reach the patient to complete the Transitions of Care (Post Inpatient/ED visit) call.   Signature  Jodelle Green, RMA

## 2023-05-15 NOTE — Transitions of Care (Post Inpatient/ED Visit) (Signed)
   05/15/2023  Name: [name] MRN: [id number] DOB: 02-28-1975  Today's TOC FU Call Status: Today's TOC FU Call Status:: Unsuccessful Call (2nd Attempt) Unsuccessful Call (1st Attempt) Date: 05/11/23 Unsuccessful Call (2nd Attempt) Date: 05/15/23  Attempted to reach the patient regarding the most recent Inpatient/ED visit.  Follow Up Plan: Additional outreach attempts will be made to reach the patient to complete the Transitions of Care (Post Inpatient/ED visit) call.   Signature  Kirby Peoples, RMA

## 2023-06-09 ENCOUNTER — Ambulatory Visit: Admitting: Nurse Practitioner

## 2023-06-13 LAB — BASIC METABOLIC PANEL WITH GFR
BUN: 10 (ref 4–21)
CO2: 19 (ref 13–22)
Chloride: 102 (ref 99–108)
Creatinine: 0.9 (ref 0.5–1.1)
Glucose: 88
Potassium: 4.3 meq/L (ref 3.5–5.1)
Sodium: 139 (ref 137–147)

## 2023-06-13 LAB — CBC AND DIFFERENTIAL
HCT: 35 — AB (ref 36–46)
Hemoglobin: 11.4 — AB (ref 12.0–16.0)
Neutrophils Absolute: 5
Platelets: 268 10*3/uL (ref 150–400)
WBC: 8.6

## 2023-06-13 LAB — HEPATIC FUNCTION PANEL
ALT: 18 U/L (ref 7–35)
AST: 30 (ref 13–35)
Alkaline Phosphatase: 176 — AB (ref 25–125)
Bilirubin, Total: 0.4

## 2023-06-13 LAB — COMPREHENSIVE METABOLIC PANEL WITH GFR
Albumin: 4.3 (ref 3.5–5.0)
Calcium: 9.1 (ref 8.7–10.7)
Globulin: 3.6
eGFR: 80

## 2023-06-13 LAB — CBC: RBC: 4.14 (ref 3.87–5.11)

## 2023-06-27 ENCOUNTER — Encounter: Payer: Self-pay | Admitting: Nurse Practitioner

## 2023-07-03 ENCOUNTER — Telehealth: Payer: Self-pay | Admitting: Nurse Practitioner

## 2023-07-03 NOTE — Telephone Encounter (Signed)
 06/09/2023 1st no show  Letter sent via mychart

## 2023-07-03 NOTE — Telephone Encounter (Signed)
 Noted

## 2023-10-03 LAB — HEPATIC FUNCTION PANEL
ALT: 27 U/L (ref 7–35)
AST: 44 — AB (ref 13–35)
Alkaline Phosphatase: 155 — AB (ref 25–125)

## 2023-10-03 LAB — COMPREHENSIVE METABOLIC PANEL WITH GFR
Albumin: 3.5 (ref 3.5–5.0)
Calcium: 8.5 — AB (ref 8.7–10.7)
Globulin: 3.3
eGFR: 68

## 2023-10-03 LAB — CBC: RBC: 3.81 — AB (ref 3.87–5.11)

## 2023-10-03 LAB — BASIC METABOLIC PANEL WITH GFR
BUN: 13 (ref 4–21)
CO2: 17 (ref 13–22)
Chloride: 103 (ref 99–108)
Creatinine: 1 (ref 0.5–1.1)
Glucose: 107
Potassium: 4.3 meq/L (ref 3.5–5.1)
Sodium: 137 (ref 137–147)

## 2023-10-03 LAB — CBC AND DIFFERENTIAL
HCT: 33 — AB (ref 36–46)
Hemoglobin: 10.8 — AB (ref 12.0–16.0)
Platelets: 291 K/uL (ref 150–400)
WBC: 7.9

## 2023-10-05 IMAGING — MG MM DIGITAL SCREENING BILAT W/ TOMO AND CAD
6 of 10 series · 6 of 30 positions shown · non-contrast
Comparison: None.

CLINICAL DATA: Screening.

EXAM:
DIGITAL SCREENING BILATERAL MAMMOGRAM WITH TOMOSYNTHESIS AND CAD
TECHNIQUE: Bilateral screening digital craniocaudal and mediolateral oblique
mammograms were obtained. Bilateral screening digital breast
tomosynthesis was performed. The images were evaluated with
computer-aided detection.

[R CC synth-2D]
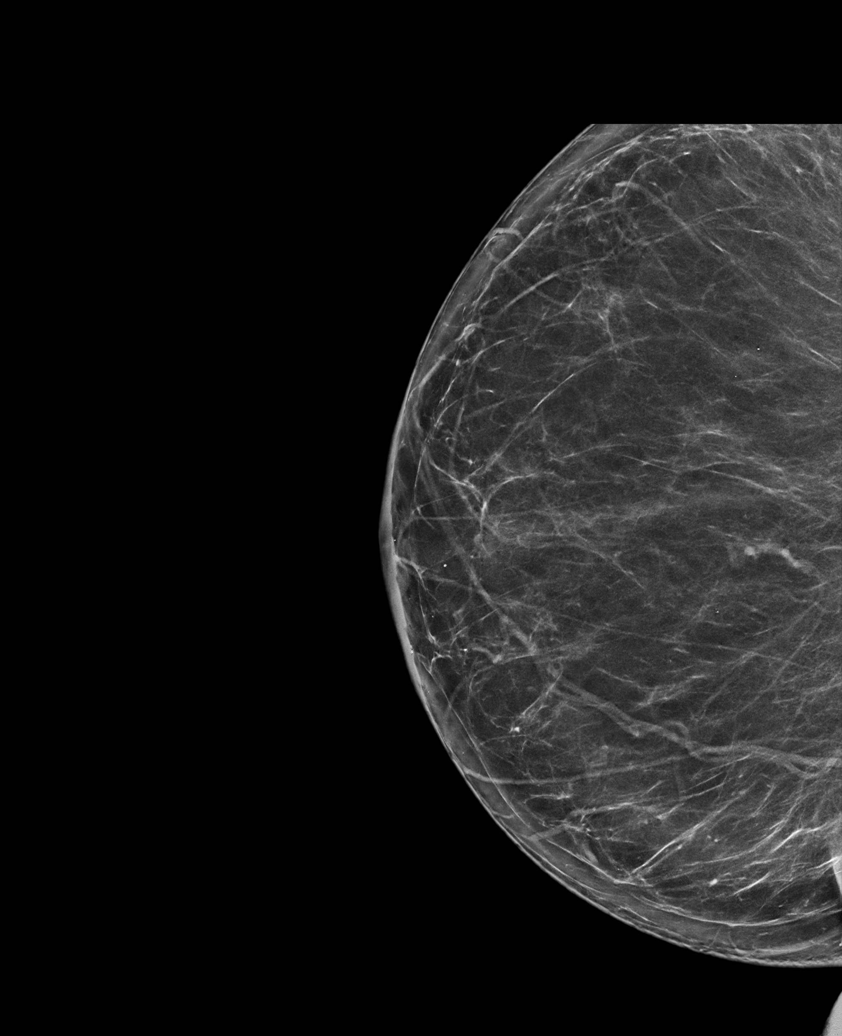

[R CV synth-2D]
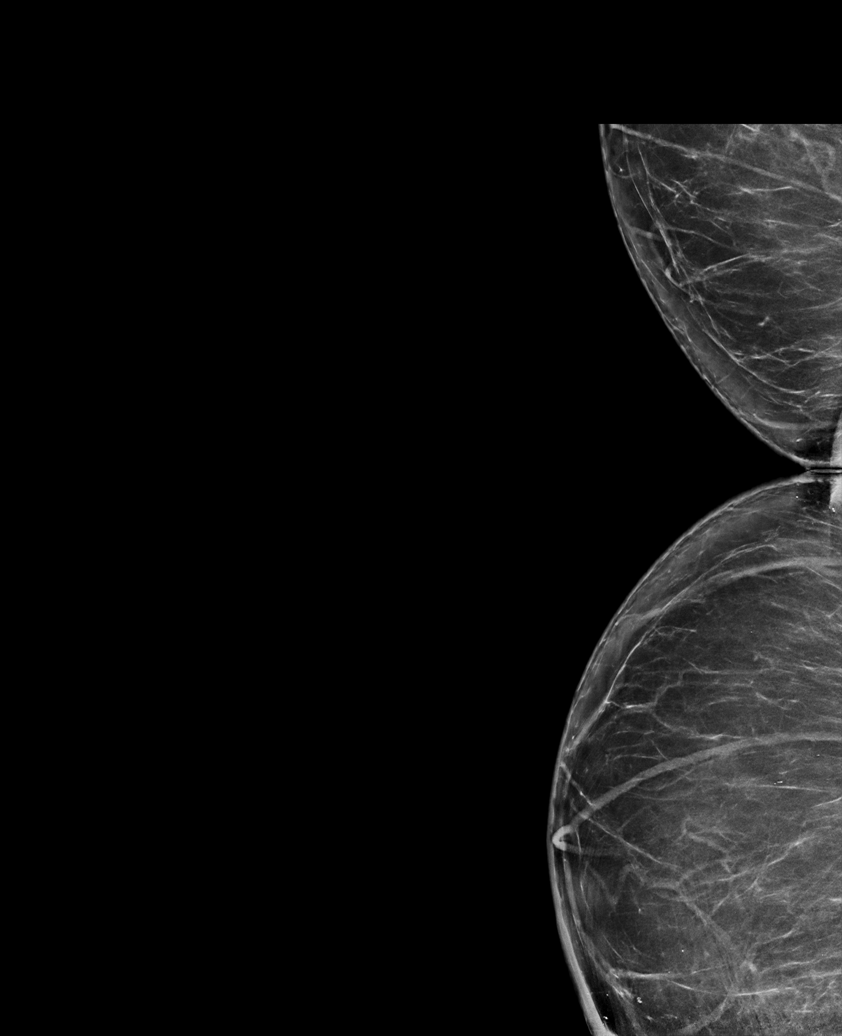

[R MLO synth-2D]
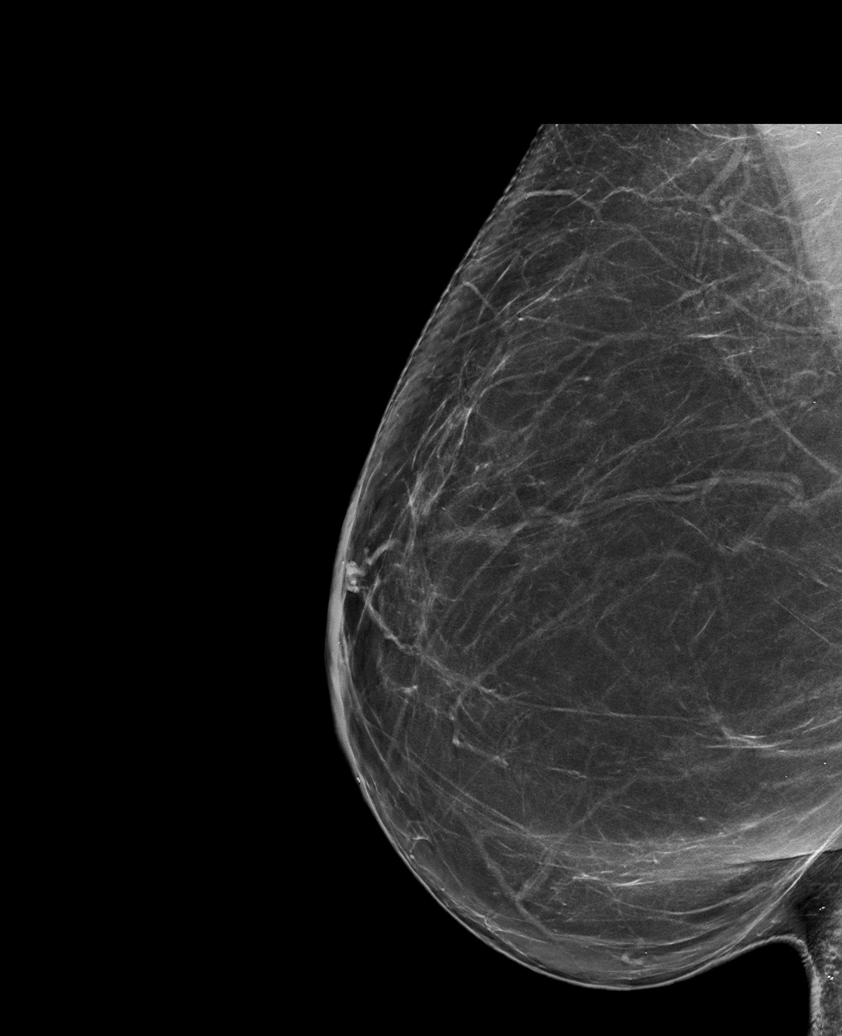

[L MLO synth-2D]
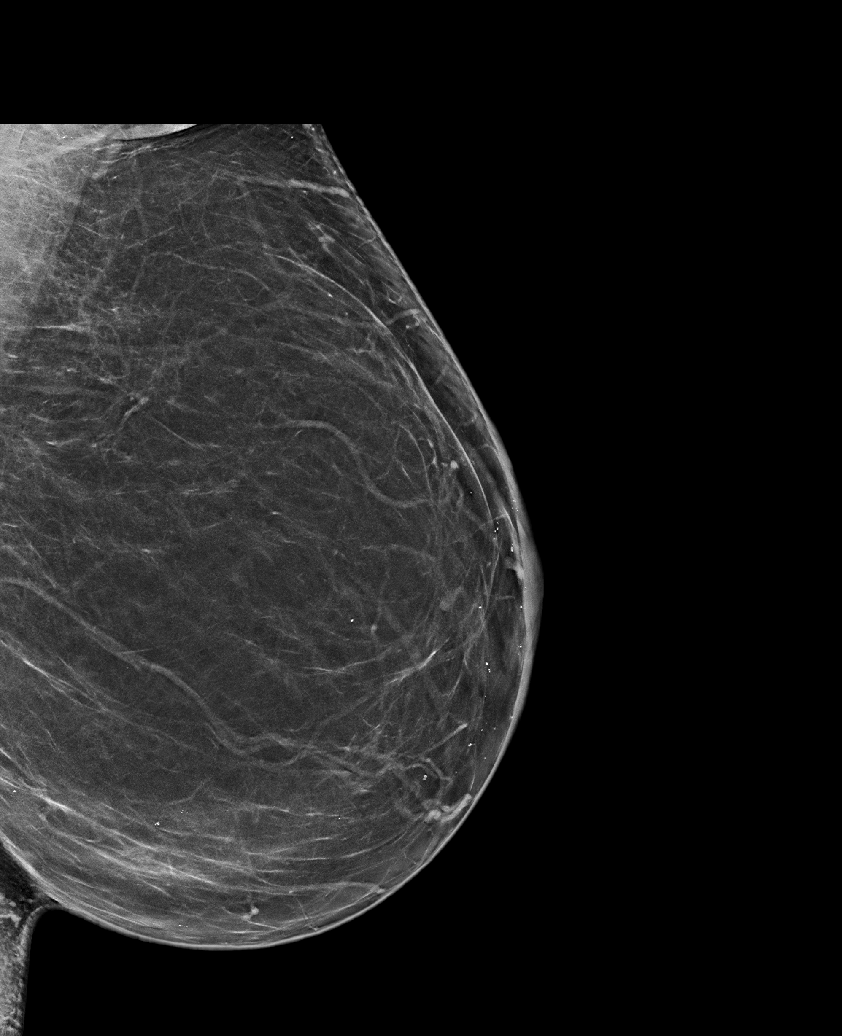

[L CC synth-2D]
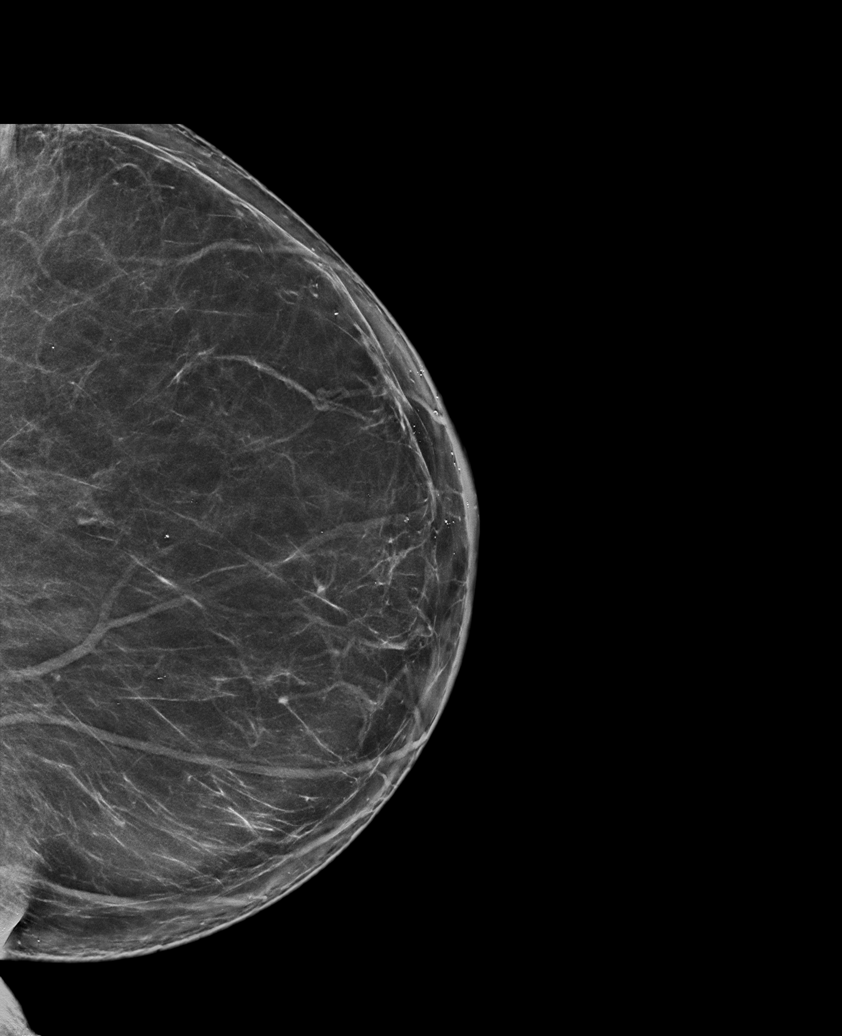

[L MLO tomo · tomo slice 41/82.0]
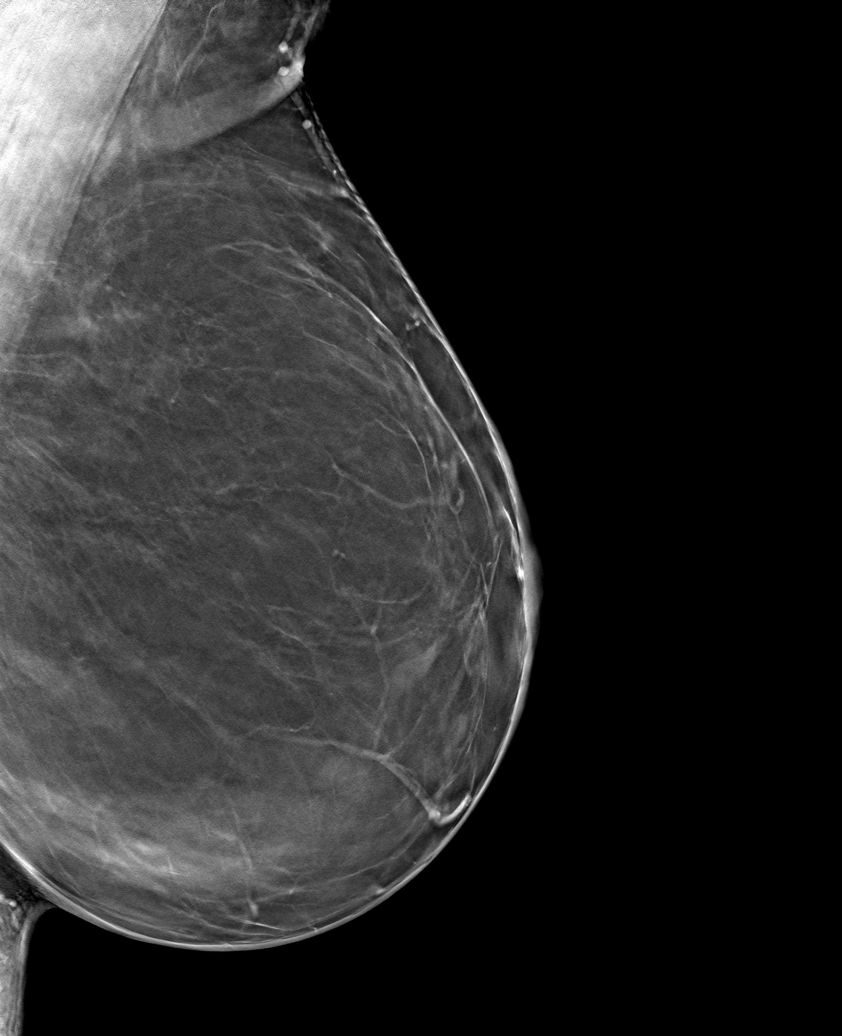

[6 of 30 positions shown; findings below may reference images not displayed]

ACR Breast Density Category b: There are scattered areas of
fibroglandular density.
FINDINGS: There are no findings suspicious for malignancy.
IMPRESSION: No mammographic evidence of malignancy. A result letter of this
screening mammogram will be mailed directly to the patient.

RECOMMENDATION:
Screening mammogram in one year. (Code:XG-X-X7B)

BI-RADS CATEGORY  1: Negative.

## 2023-11-01 ENCOUNTER — Encounter: Payer: Self-pay | Admitting: Nurse Practitioner
